# Patient Record
Sex: Female | Born: 1945 | Race: White | Hispanic: No | Marital: Married | Smoking: Former smoker
Health system: Southern US, Community
[De-identification: ages and names within clinical notes are randomized; demographics above are authoritative.]

## PROBLEM LIST (undated history)

## (undated) DIAGNOSIS — J449 Chronic obstructive pulmonary disease, unspecified: Secondary | ICD-10-CM

## (undated) DIAGNOSIS — C3490 Malignant neoplasm of unspecified part of unspecified bronchus or lung: Secondary | ICD-10-CM

## (undated) DIAGNOSIS — C419 Malignant neoplasm of bone and articular cartilage, unspecified: Secondary | ICD-10-CM

## (undated) HISTORY — DX: Malignant neoplasm of bone and articular cartilage, unspecified: C41.9

## (undated) HISTORY — PX: PORTACATH PLACEMENT: SHX2246

## (undated) HISTORY — DX: Chronic obstructive pulmonary disease, unspecified: J44.9

## (undated) HISTORY — DX: Malignant neoplasm of unspecified part of unspecified bronchus or lung: C34.90

---

## 2011-01-28 HISTORY — PX: HIP SURGERY: SHX245

## 2013-11-07 LAB — HM MAMMOGRAPHY

## 2014-01-31 ENCOUNTER — Ambulatory Visit (INDEPENDENT_AMBULATORY_CARE_PROVIDER_SITE_OTHER): Payer: Medicare Other | Admitting: Physician Assistant

## 2014-01-31 ENCOUNTER — Encounter: Payer: Self-pay | Admitting: Physician Assistant

## 2014-01-31 VITALS — BP 128/87 | HR 88 | Wt 211.0 lb

## 2014-01-31 DIAGNOSIS — J449 Chronic obstructive pulmonary disease, unspecified: Secondary | ICD-10-CM | POA: Insufficient documentation

## 2014-01-31 DIAGNOSIS — K219 Gastro-esophageal reflux disease without esophagitis: Secondary | ICD-10-CM | POA: Insufficient documentation

## 2014-01-31 DIAGNOSIS — J441 Chronic obstructive pulmonary disease with (acute) exacerbation: Secondary | ICD-10-CM

## 2014-01-31 DIAGNOSIS — E559 Vitamin D deficiency, unspecified: Secondary | ICD-10-CM

## 2014-01-31 DIAGNOSIS — E785 Hyperlipidemia, unspecified: Secondary | ICD-10-CM

## 2014-01-31 DIAGNOSIS — M810 Age-related osteoporosis without current pathological fracture: Secondary | ICD-10-CM

## 2014-01-31 DIAGNOSIS — I10 Essential (primary) hypertension: Secondary | ICD-10-CM

## 2014-01-31 DIAGNOSIS — J411 Mucopurulent chronic bronchitis: Secondary | ICD-10-CM

## 2014-01-31 DIAGNOSIS — R609 Edema, unspecified: Secondary | ICD-10-CM

## 2014-01-31 MED ORDER — ALBUTEROL SULFATE HFA 108 (90 BASE) MCG/ACT IN AERS: 2.0000 | INHALATION_SPRAY | Freq: Four times a day (QID) | RESPIRATORY_TRACT | Status: DC | PRN

## 2014-01-31 MED ORDER — AZITHROMYCIN 250 MG PO TABS: ORAL_TABLET | ORAL | Status: DC

## 2014-01-31 MED ORDER — PREDNISONE 50 MG PO TABS: ORAL_TABLET | ORAL | Status: DC

## 2014-01-31 NOTE — Patient Instructions (Signed)
Zpak and prednisone given today.  Mucinex(regular) twice a day can be a great help to get mucus up.  Albuterol to use 2 puffs every 4-6 hours as needed.

## 2014-01-31 NOTE — Progress Notes (Signed)
Subjective:    Patient ID: Denise Miles, female    DOB: 05/03/1945, 69 y.o.   MRN: [id number]  HPI Pt is a 69 yo female who presents to the clinic to establish care.   .. Active Ambulatory Problems    Diagnosis Date Noted  . COPD (chronic obstructive pulmonary disease) 01/31/2014  . Benign essential HTN 01/31/2014  . Hyperlipidemia 01/31/2014  . Vitamin D deficiency 01/31/2014  . GERD (gastroesophageal reflux disease) 01/31/2014  . Osteoporosis 01/31/2014  . Peripheral edema 01/31/2014   Resolved Ambulatory Problems    Diagnosis Date Noted  . No Resolved Ambulatory Problems   No Additional Past Medical History   .Marland Kitchen Family History  Problem Relation Age of Onset  . Diabetes Mother   . Heart attack Father   . Alcohol abuse Sister   . Cancer Sister     lung  . Schizophrenia Sister    .Marland Kitchen History   Social History  . Marital Status: Married    Spouse Name: N/A    Number of Children: N/A  . Years of Education: N/A   Occupational History  . Not on file.   Social History Main Topics  . Smoking status: Current Some Day Smoker  . Smokeless tobacco: Not on file  . Alcohol Use: No  . Drug Use: No  . Sexual Activity: Not on file   Other Topics Concern  . Not on file   Social History Narrative  . No narrative on file   Pt has no complaints and does not need refills today.   She has not felt well for 3 weeks. She has COPD. She has notice her breathing is worse. She is more SOB. Continues on Spiriva. She does have a cough that is very productive. She has some sinus pressure and throat drainage. No ear pain or ST. She has tried aleve cold and sinus with little benefit. No fever but had chills off and on.    Review of Systems  All other systems reviewed and are negative.      Objective:   Physical Exam  Constitutional: She is oriented to person, place, and time. She appears well-developed and well-nourished.  HENT:  Head: Normocephalic and atraumatic.  Right  Ear: External ear normal.  Left Ear: External ear normal.  TM"s clear bilaterally with a little erythema in external canal.  Oropharynx erythematous with PND present.  Nares, billaterally red and swollen.   Eyes: Conjunctivae are normal. Right eye exhibits no discharge. Left eye exhibits no discharge.  Neck: Normal range of motion. Neck supple.  Cardiovascular: Normal rate, regular rhythm and normal heart sounds.   Pulmonary/Chest:  Rhonchi and mild wheezing throughout both lungs.  No crackles.   Lymphadenopathy:    She has no cervical adenopathy.  Neurological: She is alert and oriented to person, place, and time.  Skin: Skin is dry.  Psychiatric: She has a normal mood and affect. Her behavior is normal.          Assessment & Plan:  COPD exacerbation- treated with zpak and prednisone for 5 days. Albuterol inhaler given to use 2 puffs every 4-6 hours as needed for SOB. Continue on spiriva. Follow up if not improving in next 2 days.   Vitamin D def- continue on vitamin D.   Peripheral edema- continue on lasix and potassium. Needs follow up in next 2 months for blood work.   osteoprosis- prolia injection twice a year.   GERD- managed without medication currently. Diet only.  Hyperlipidemia- on lovastatin. Needs recheck labs once get records and see when next check is.   HTN- controlled. Follow up in next 77months.

## 2014-02-03 ENCOUNTER — Ambulatory Visit (INDEPENDENT_AMBULATORY_CARE_PROVIDER_SITE_OTHER): Payer: Medicare Other | Admitting: Physician Assistant

## 2014-02-03 ENCOUNTER — Encounter: Payer: Self-pay | Admitting: Physician Assistant

## 2014-02-03 VITALS — BP 145/91 | HR 112 | Wt 208.0 lb

## 2014-02-03 DIAGNOSIS — Z79899 Other long term (current) drug therapy: Secondary | ICD-10-CM

## 2014-02-03 DIAGNOSIS — E876 Hypokalemia: Secondary | ICD-10-CM

## 2014-02-03 DIAGNOSIS — R609 Edema, unspecified: Secondary | ICD-10-CM

## 2014-02-03 DIAGNOSIS — I872 Venous insufficiency (chronic) (peripheral): Secondary | ICD-10-CM

## 2014-02-03 MED ORDER — FUROSEMIDE 40 MG PO TABS: ORAL_TABLET | ORAL | Status: DC

## 2014-02-03 NOTE — Patient Instructions (Signed)

## 2014-02-03 NOTE — Progress Notes (Signed)
   Subjective:    Patient ID: Denise Miles, female    DOB: 1945/09/03, 69 y.o.   MRN: [id number]  HPI  Pt presents to the clinic with worsening bilateral ankle swelling over past 2 days. She is taking lasix 40mg  daily but does not feel like helping. No started any new medications. Admits to not wearing compression stockings and not elevating feet. No pain in ankles. No fall. No redness. Hx of edema.     Review of Systems  All other systems reviewed and are negative.      Objective:   Physical Exam  Constitutional: She is oriented to person, place, and time. She appears well-developed and well-nourished.  HENT:  Head: Normocephalic and atraumatic.  Cardiovascular: Regular rhythm and normal heart sounds.   Tachycardia at 112.   Pulmonary/Chest: Effort normal and breath sounds normal. She has no wheezes.  Neurological: She is alert and oriented to person, place, and time.  Skin:  1+ pitting edema bilaterally not extending past ankles. No redness, pain to palpation evidence of infection.    Psychiatric: She has a normal mood and affect. Her behavior is normal.          Assessment & Plan:  Bilateral ankle edema/hx of hypokalemia due to diruretic/chronic venous insuffiency- will check cmp today. Reminded to take potassium any time takes diruretic. Pt occasionally takes bumex stop taking. Increase lasix to 40mg  bid for next 3 days then decrease back down to once a day or as needed. Discussed importance of compression and elevation. Wrapped in ace wrap today. HO given of venous insuffiency. Follow up as needed. Avoid salt.

## 2014-02-04 LAB — COMPLETE METABOLIC PANEL WITH GFR
ALBUMIN: 3.9 g/dL (ref 3.5–5.2)
ALT: 17 U/L (ref 0–35)
AST: 18 U/L (ref 0–37)
Alkaline Phosphatase: 81 U/L (ref 39–117)
BUN: 20 mg/dL (ref 6–23)
CO2: 27 meq/L (ref 19–32)
Calcium: 9.8 mg/dL (ref 8.4–10.5)
Chloride: 97 mEq/L (ref 96–112)
Creat: 0.89 mg/dL (ref 0.50–1.10)
GFR, EST AFRICAN AMERICAN: 76 mL/min
GFR, EST NON AFRICAN AMERICAN: 66 mL/min
Glucose, Bld: 143 mg/dL — ABNORMAL HIGH (ref 70–99)
Potassium: 3.4 mEq/L — ABNORMAL LOW (ref 3.5–5.3)
Sodium: 137 mEq/L (ref 135–145)
TOTAL PROTEIN: 7.4 g/dL (ref 6.0–8.3)
Total Bilirubin: 0.6 mg/dL (ref 0.2–1.2)

## 2014-02-05 DIAGNOSIS — I872 Venous insufficiency (chronic) (peripheral): Secondary | ICD-10-CM | POA: Insufficient documentation

## 2014-02-05 DIAGNOSIS — E876 Hypokalemia: Secondary | ICD-10-CM | POA: Insufficient documentation

## 2014-02-13 ENCOUNTER — Ambulatory Visit (INDEPENDENT_AMBULATORY_CARE_PROVIDER_SITE_OTHER): Payer: Medicare Other | Admitting: Physician Assistant

## 2014-02-13 ENCOUNTER — Ambulatory Visit (INDEPENDENT_AMBULATORY_CARE_PROVIDER_SITE_OTHER): Payer: Medicare Other

## 2014-02-13 ENCOUNTER — Encounter: Payer: Self-pay | Admitting: Physician Assistant

## 2014-02-13 VITALS — BP 112/73 | HR 81 | Temp 98.0°F | Ht 70.0 in | Wt 208.0 lb

## 2014-02-13 DIAGNOSIS — R05 Cough: Secondary | ICD-10-CM

## 2014-02-13 DIAGNOSIS — J449 Chronic obstructive pulmonary disease, unspecified: Secondary | ICD-10-CM

## 2014-02-13 DIAGNOSIS — R059 Cough, unspecified: Secondary | ICD-10-CM

## 2014-02-13 DIAGNOSIS — J209 Acute bronchitis, unspecified: Secondary | ICD-10-CM

## 2014-02-13 DIAGNOSIS — J441 Chronic obstructive pulmonary disease with (acute) exacerbation: Secondary | ICD-10-CM

## 2014-02-13 DIAGNOSIS — I872 Venous insufficiency (chronic) (peripheral): Secondary | ICD-10-CM

## 2014-02-13 MED ORDER — METHYLPREDNISOLONE SODIUM SUCC 125 MG IJ SOLR: 125.0000 mg | Freq: Once | INTRAMUSCULAR | Status: DC

## 2014-02-13 MED ORDER — TIOTROPIUM BROMIDE-OLODATEROL 2.5-2.5 MCG/ACT IN AERS: 2.0000 | INHALATION_SPRAY | Freq: Every day | RESPIRATORY_TRACT | Status: DC

## 2014-02-13 MED ORDER — DOXYCYCLINE HYCLATE 100 MG PO TABS: 100.0000 mg | ORAL_TABLET | Freq: Two times a day (BID) | ORAL | Status: DC

## 2014-02-13 NOTE — Patient Instructions (Addendum)
Mucinex(guanfenasin) twice a day.  Start Doxycycline.  Stop spiriva.  Start stiolto.

## 2014-02-13 NOTE — Progress Notes (Signed)
   Subjective:    Patient ID: Denise Miles, female    DOB: 13-Jul-1945, 69 y.o.   MRN: [id number]  HPI  Patient is a 69 year old female who presents to the clinic with cough and chest congestion. She does have COPD. She is using Spiriva daily. She is concerned she's not getting the dose. She feels like some days the capsule works and other days it doesn't. She denies any overt wheezing but just feels like her chest is tight. She is coughing more week. Cough is productive with greenish to white sputum. She denies any fever or chills.  Venous insufficiency-she did pick up some compression stockings and noticed they are working great. She is only taking 40 mg of Lasix daily.    Review of Systems  All other systems reviewed and are negative.      Objective:   Physical Exam  Constitutional: She is oriented to person, place, and time. She appears well-developed and well-nourished.  HENT:  Head: Normocephalic and atraumatic.  Right Ear: External ear normal.  Left Ear: External ear normal.  Mouth/Throat: Oropharynx is clear and moist.  Eyes: Conjunctivae are normal.  Neck: Normal range of motion. Neck supple.  Cardiovascular: Normal rate, regular rhythm and normal heart sounds.   Pulmonary/Chest: Effort normal and breath sounds normal.  No wheezing.  Bilateral coarse breath sounds and rhonchi.   Lymphadenopathy:    She has no cervical adenopathy.  Neurological: She is alert and oriented to person, place, and time.  Skin: Skin is dry.  Compression stockings on today. No pitting edema bilaterally.   Psychiatric: She has a normal mood and affect. Her behavior is normal.          Assessment & Plan:  COPD exacerbation/acute bronchitis- we'll go ahead and get chest x-rays since she has had 2 exacerbations this month. We'll treat with doxycycline for 10 days. Continue to use albuterol inhaler as needed for shortness of breath. Shot of Solu-Medrol 125 mg given IM today with no  complications. I'm concerned patient was getting Spiriva dosage due to complications with device. Even if she was getting the dosage she is not controlled on current therapy. Patient was given sample of Stiolto for more coverage. Went over how to use device and first 2 puffs given today. Discussed can also use some mucinex when mucus starts to get thick.  Follow up in one month.   Venous insuffiency- swelling much better today with compression stockings. Continue to use compression stockings regularly. The swelling space maintained decrease to one half of the 40 mg of Lasix daily. Only increase the swelling increases. Encouraged low-salt diet.

## 2014-02-22 ENCOUNTER — Telehealth: Payer: Self-pay | Admitting: *Deleted

## 2014-02-22 DIAGNOSIS — Z79899 Other long term (current) drug therapy: Secondary | ICD-10-CM

## 2014-02-22 DIAGNOSIS — E876 Hypokalemia: Secondary | ICD-10-CM

## 2014-02-22 DIAGNOSIS — Z0000 Encounter for general adult medical examination without abnormal findings: Secondary | ICD-10-CM

## 2014-02-22 DIAGNOSIS — M810 Age-related osteoporosis without current pathological fracture: Secondary | ICD-10-CM

## 2014-02-22 DIAGNOSIS — E785 Hyperlipidemia, unspecified: Secondary | ICD-10-CM

## 2014-02-22 DIAGNOSIS — E559 Vitamin D deficiency, unspecified: Secondary | ICD-10-CM

## 2014-02-22 NOTE — Telephone Encounter (Signed)
Labs ordered for upcoming CPE.

## 2014-02-24 ENCOUNTER — Encounter: Payer: Self-pay | Admitting: Physician Assistant

## 2014-02-24 ENCOUNTER — Ambulatory Visit (INDEPENDENT_AMBULATORY_CARE_PROVIDER_SITE_OTHER): Payer: Medicare Other | Admitting: Physician Assistant

## 2014-02-24 VITALS — BP 116/69 | HR 91 | Ht 70.0 in | Wt 209.0 lb

## 2014-02-24 DIAGNOSIS — M810 Age-related osteoporosis without current pathological fracture: Secondary | ICD-10-CM

## 2014-02-24 DIAGNOSIS — F32A Depression, unspecified: Secondary | ICD-10-CM

## 2014-02-24 DIAGNOSIS — F329 Major depressive disorder, single episode, unspecified: Secondary | ICD-10-CM

## 2014-02-24 DIAGNOSIS — J411 Mucopurulent chronic bronchitis: Secondary | ICD-10-CM

## 2014-02-24 DIAGNOSIS — F411 Generalized anxiety disorder: Secondary | ICD-10-CM | POA: Insufficient documentation

## 2014-02-24 LAB — COMPLETE METABOLIC PANEL WITH GFR
ALBUMIN: 3.3 g/dL — AB (ref 3.5–5.2)
ALT: 11 U/L (ref 0–35)
AST: 12 U/L (ref 0–37)
Alkaline Phosphatase: 85 U/L (ref 39–117)
BILIRUBIN TOTAL: 1.1 mg/dL (ref 0.2–1.2)
BUN: 16 mg/dL (ref 6–23)
CALCIUM: 9.1 mg/dL (ref 8.4–10.5)
CHLORIDE: 104 meq/L (ref 96–112)
CO2: 26 mEq/L (ref 19–32)
Creat: 0.82 mg/dL (ref 0.50–1.10)
GFR, Est African American: 84 mL/min
GFR, Est Non African American: 73 mL/min
GLUCOSE: 87 mg/dL (ref 70–99)
Potassium: 3.7 mEq/L (ref 3.5–5.3)
SODIUM: 141 meq/L (ref 135–145)
TOTAL PROTEIN: 6 g/dL (ref 6.0–8.3)

## 2014-02-24 LAB — LIPID PANEL
CHOL/HDL RATIO: 3.8 ratio
Cholesterol: 182 mg/dL (ref 0–200)
HDL: 48 mg/dL (ref >39)
LDL CALC: 112 mg/dL — AB (ref 0–99)
TRIGLYCERIDES: 110 mg/dL (ref <150)
VLDL: 22 mg/dL (ref 0–40)

## 2014-02-24 MED ORDER — BUPROPION HCL ER (XL) 150 MG PO TB24: 150.0000 mg | ORAL_TABLET | Freq: Every day | ORAL | Status: DC

## 2014-02-24 NOTE — Progress Notes (Signed)
   Subjective:    Patient ID: Denise Miles, female    DOB: [birth year], 69 y.o.   MRN: [id number]  HPI Patient is 69 year old female who presents to the clinic for one-month follow-up after starting stilto for COPD. Patient is doing wonderfully. She has not been having to use her rescue inhaler. She still has some minor sputum but able to get up without any significant problems. She's feeling great.  Depression/anxiety-patient has been on Wellbutrin since the death of her son. She feels like she does not need this anymore. She's been on many years. She has no anxiety or depression per patient.     Review of Systems  All other systems reviewed and are negative.      Objective:   Physical Exam  Constitutional: She is oriented to person, place, and time. She appears well-developed and well-nourished.  HENT:  Head: Normocephalic and atraumatic.  Cardiovascular: Normal rate, regular rhythm and normal heart sounds.   Pulmonary/Chest: Effort normal and breath sounds normal. She has no wheezes.  Neurological: She is alert and oriented to person, place, and time.  Psychiatric: She has a normal mood and affect. Her behavior is normal.          Assessment & Plan:  COPD-pt is doing great on stiolto. Continue using once a day. Follow up in 6 months.   Depression/anxiety- GAD-7 was 0. PH Q9 was 0. Discuss Wellbutrin 150 mg every other day for the next 4 weeks then stop. If she finds her anxiety starts to increase during the taper down. Certainly go back up to full dose and we'll refill accordingly. Seems like patient was going through an acute time of stress and likely only needed the medicine transiently.  Osteoporosis-patient has one properly a injection at home. She is due in April. She will alert [place] for next dose.  Patient had fasting labs done today. We'll call with results. Patient still needs a Medicare annual wellness exam.

## 2014-02-25 LAB — VITAMIN D 25 HYDROXY (VIT D DEFICIENCY, FRACTURES): Vit D, 25-Hydroxy: 39 ng/mL (ref 30–100)

## 2014-03-10 ENCOUNTER — Other Ambulatory Visit: Payer: Self-pay

## 2014-03-10 MED ORDER — TIOTROPIUM BROMIDE-OLODATEROL 2.5-2.5 MCG/ACT IN AERS: 2.0000 | INHALATION_SPRAY | Freq: Every day | RESPIRATORY_TRACT | Status: DC

## 2014-03-24 ENCOUNTER — Ambulatory Visit (INDEPENDENT_AMBULATORY_CARE_PROVIDER_SITE_OTHER): Payer: Medicare Other | Admitting: Physician Assistant

## 2014-03-24 ENCOUNTER — Encounter: Payer: Self-pay | Admitting: Physician Assistant

## 2014-03-24 VITALS — BP 124/86 | HR 93 | Wt 202.0 lb

## 2014-03-24 DIAGNOSIS — Z0000 Encounter for general adult medical examination without abnormal findings: Secondary | ICD-10-CM

## 2014-03-24 DIAGNOSIS — Z1239 Encounter for other screening for malignant neoplasm of breast: Secondary | ICD-10-CM

## 2014-03-24 DIAGNOSIS — Z13820 Encounter for screening for osteoporosis: Secondary | ICD-10-CM

## 2014-03-24 DIAGNOSIS — Z1231 Encounter for screening mammogram for malignant neoplasm of breast: Secondary | ICD-10-CM

## 2014-03-24 MED ORDER — ATENOLOL 25 MG PO TABS: 25.0000 mg | ORAL_TABLET | Freq: Every day | ORAL | Status: DC

## 2014-03-24 MED ORDER — TIOTROPIUM BROMIDE-OLODATEROL 2.5-2.5 MCG/ACT IN AERS: 2.0000 | INHALATION_SPRAY | Freq: Every day | RESPIRATORY_TRACT | Status: DC

## 2014-03-24 NOTE — Progress Notes (Signed)
Subjective:    Denise Miles is a 69 y.o. female who presents for Medicare Annual/Subsequent preventive examination.  Preventive Screening-Counseling & Management  Tobacco History  Smoking status  . Current Some Day Smoker  Smokeless tobacco  . Not on file     Problems Prior to Visit  None today.   Current Problems (verified) Patient Active Problem List   Diagnosis Date Noted  . Depression 02/24/2014  . Anxiety state 02/24/2014  . Hypokalemia 02/05/2014  . Venous (peripheral) insufficiency 02/05/2014  . COPD (chronic obstructive pulmonary disease) 01/31/2014  . Benign essential HTN 01/31/2014  . Hyperlipidemia 01/31/2014  . Vitamin D deficiency 01/31/2014  . GERD (gastroesophageal reflux disease) 01/31/2014  . Osteoporosis 01/31/2014  . Peripheral edema 01/31/2014    Medications Prior to Visit Current Outpatient Prescriptions on File Prior to Visit  Medication Sig Dispense Refill  . albuterol (PROVENTIL HFA;VENTOLIN HFA) 108 (90 BASE) MCG/ACT inhaler Inhale 2 puffs into the lungs every 6 (six) hours as needed for wheezing or shortness of breath. 1 Inhaler 2  . cetirizine (ZYRTEC) 10 MG tablet Take 10 mg by mouth daily.    Marland Kitchen denosumab (PROLIA) 60 MG/ML SOLN injection Inject 60 mg into the skin every 6 (six) months. Administer in upper arm, thigh, or abdomen    . furosemide (LASIX) 40 MG tablet Take 1-2 tablets daily as needed for peripheral edema. 30 tablet 0  . lovastatin (MEVACOR) 40 MG tablet Take 40 mg by mouth at bedtime.    . Multiple Vitamin (MULTIVITAMIN) tablet Take 1 tablet by mouth daily.    Marland Kitchen Plant Sterol Stanol-Pantethine (CHOLEST OFF COMPLETE PO) Take by mouth daily.    . potassium chloride (K-DUR,KLOR-CON) 10 MEQ tablet Take 10 mEq by mouth 2 (two) times daily.    Marland Kitchen triamcinolone (NASACORT ALLERGY 24HR) 55 MCG/ACT AERO nasal inhaler Place 2 sprays into the nose daily.    . Vitamin D, Ergocalciferol, (DRISDOL) 50000 UNITS CAPS capsule Take 50,000 Units by  mouth every 7 (seven) days.     No current facility-administered medications on file prior to visit.    Current Medications (verified) Current Outpatient Prescriptions  Medication Sig Dispense Refill  . albuterol (PROVENTIL HFA;VENTOLIN HFA) 108 (90 BASE) MCG/ACT inhaler Inhale 2 puffs into the lungs every 6 (six) hours as needed for wheezing or shortness of breath. 1 Inhaler 2  . atenolol (TENORMIN) 25 MG tablet Take 1 tablet (25 mg total) by mouth daily. 30 tablet 5  . cetirizine (ZYRTEC) 10 MG tablet Take 10 mg by mouth daily.    Marland Kitchen denosumab (PROLIA) 60 MG/ML SOLN injection Inject 60 mg into the skin every 6 (six) months. Administer in upper arm, thigh, or abdomen    . furosemide (LASIX) 40 MG tablet Take 1-2 tablets daily as needed for peripheral edema. 30 tablet 0  . lovastatin (MEVACOR) 40 MG tablet Take 40 mg by mouth at bedtime.    . Multiple Vitamin (MULTIVITAMIN) tablet Take 1 tablet by mouth daily.    Marland Kitchen Plant Sterol Stanol-Pantethine (CHOLEST OFF COMPLETE PO) Take by mouth daily.    . potassium chloride (K-DUR,KLOR-CON) 10 MEQ tablet Take 10 mEq by mouth 2 (two) times daily.    . Tiotropium Bromide-Olodaterol (STIOLTO RESPIMAT) 2.5-2.5 MCG/ACT AERS Inhale 2 puffs into the lungs daily. 1 Inhaler 5  . triamcinolone (NASACORT ALLERGY 24HR) 55 MCG/ACT AERO nasal inhaler Place 2 sprays into the nose daily.    . Vitamin D, Ergocalciferol, (DRISDOL) 50000 UNITS CAPS capsule Take 50,000 Units by  mouth every 7 (seven) days.     No current facility-administered medications for this visit.     Allergies (verified) Latex   PAST HISTORY  Family History Family History  Problem Relation Age of Onset  . Diabetes Mother   . Heart attack Father   . Alcohol abuse Sister   . Cancer Sister     lung  . Schizophrenia Sister     Social History History  Substance Use Topics  . Smoking status: Current Some Day Smoker  . Smokeless tobacco: Not on file  . Alcohol Use: No     Are there  smokers in your home (other than you)? No  Risk Factors Current exercise habits: The patient does not participate in regular exercise at present.  Dietary issues discussed: none   Cardiac risk factors: advanced age (older than 79 for men, 37 for women), dyslipidemia, obesity (BMI >= 30 kg/m2) and sedentary lifestyle.  Depression Screen (Note: if answer to either of the following is "Yes", a more complete depression screening is indicated)   Over the past two weeks, have you felt down, depressed or hopeless? No  Over the past two weeks, have you felt little interest or pleasure in doing things? No  Have you lost interest or pleasure in daily life? No  Do you often feel hopeless? No  Do you cry easily over simple problems? No  Activities of Daily Living In your present state of health, do you have any difficulty performing the following activities?:  Driving? No Managing money?  No Feeding yourself? No Getting from bed to chair? No Climbing a flight of stairs? No Preparing food and eating?: No Bathing or showering? No Getting dressed: No Getting to the toilet? No Using the toilet:No Moving around from place to place: No In the past year have you fallen or had a near fall?:No   Are you sexually active?  No  Do you have more than one partner?  No  Hearing Difficulties: No Do you often ask people to speak up or repeat themselves? No Do you experience ringing or noises in your ears? No Do you have difficulty understanding soft or whispered voices? No   Do you feel that you have a problem with memory? No  Do you often misplace items? No  Do you feel safe at home?  Yes  Cognitive Testing  Alert? Yes  Normal Appearance?Yes  Oriented to person? Yes  Place? Yes   Time? Yes  Recall of three objects?  Yes  Can perform simple calculations? Yes  Displays appropriate judgment?Yes  Can read the correct time from a watch face?Yes   Advanced Directives have been discussed with the  patient? Yes  List the Names of Other Physician/Practitioners you currently use: 1.    Indicate any recent Medical Services you may have received from other than [facility] providers in the past year (date may be approximate).  Immunization History  Administered Date(s) Administered  . Influenza-Unspecified 09/27/2013  . Pneumococcal Conjugate-13 09/27/2013    Screening Tests Health Maintenance  Topic Date Due  . TETANUS/TDAP  08/19/1962  . MAMMOGRAM  08/18/1993  . COLONOSCOPY  08/18/1993  . DEXA SCAN  08/18/2008  . PNEUMOCOCCAL POLYSACCHARIDE VACCINE AGE 50 AND OVER  08/18/2008  . ZOSTAVAX  03/25/2015 (Originally 08/19/2003)  . INFLUENZA VACCINE  08/28/2014    All answers were reviewed with the patient and necessary referrals were made:  [facility], JADE, PA-C   03/24/2014   History reviewed: allergies, current medications, past  family history, past medical history, past social history, past surgical history and problem list  Review of Systems A comprehensive review of systems was negative.    Objective:     Vision by Snellen chart: pt has eye exam coming up. No vision changes or complaints.   Body mass index is 28.98 kg/(m^2). BP 124/86 mmHg  Pulse 93  Wt 202 lb (91.627 kg)  SpO2 100%  BP 124/86 mmHg  Pulse 93  Wt 202 lb (91.627 kg)  SpO2 100%  General Appearance:    Alert, cooperative, no distress, appears stated age  Head:    Normocephalic, without obvious abnormality, atraumatic  Eyes:    PERRL, conjunctiva/corneas clear, EOM's intact, fundi    benign, both eyes  Ears:    Normal TM's and external ear canals, both ears  Nose:   Nares normal, septum midline, mucosa normal, no drainage    or sinus tenderness  Throat:   Lips, mucosa, and tongue normal; teeth and gums normal  Neck:   Supple, symmetrical, trachea midline, no adenopathy;    thyroid:  no enlargement/tenderness/nodules; no carotid   bruit or JVD  Back:     Symmetric, no curvature, ROM normal, no CVA  tenderness  Lungs:     Clear to auscultation bilaterally, respirations unlabored  Chest Wall:    No tenderness or deformity   Heart:    Regular rate and rhythm, S1 and S2 normal, no murmur, rub   or gallop  Breast Exam:    Not done.   Abdomen:     Soft, non-tender, bowel sounds active all four quadrants,    no masses, no organomegaly  Genitalia:  Not done.   Rectal:  Not done.   Extremities:   Extremities normal, atraumatic, no cyanosis or edema  Pulses:   2+ and symmetric all extremities  Skin:   Skin color, texture, turgor normal, no rashes or lesions  Lymph nodes:   Cervical, supraclavicular, and axillary nodes normal  Neurologic:   CNII-XII intact, normal strength, sensation and reflexes    throughout       Assessment:          Plan:     During the course of the visit the patient was educated and counseled about appropriate screening and preventive services including:    Pneumococcal vaccine   Screening mammography  Bone densitometry screening  Colorectal cancer screening  Will ordered mammogram and bone density.   Per pt colonoscopy, tdap, pneumonia vaccines done. We are still waiting for records.   Make appt in may for prolia injection(nurse visit only)  Dicussed labs in office looks good.    Patient Instructions (the written plan) was given to the patient.  Medicare Attestation I have personally reviewed: The patient's medical and social history Their use of alcohol, tobacco or illicit drugs Their current medications and supplements The patient's functional ability including ADLs,fall risks, home safety risks, cognitive, and hearing and visual impairment Diet and physical activities Evidence for depression or mood disorders  The patient's weight, height, BMI, and visual acuity have been recorded in the chart.  I have made referrals, counseling, and provided education to the patient based on review of the above and I have provided the patient with a  written personalized care plan for preventive services.     Iran Planas, PA-C   03/24/2014

## 2014-03-24 NOTE — Patient Instructions (Addendum)
Will order bone density and mammogram.   Keeping You Healthy  Get These Tests  Blood Pressure- Have your blood pressure checked by your healthcare provider at least once a year.  Normal blood pressure is 120/80.  Weight- Have your body mass index (BMI) calculated to screen for obesity.  BMI is a measure of body fat based on height and weight.  You can calculate your own BMI at [employer].it  Cholesterol- Have your cholesterol checked every year.  Diabetes- Have your blood sugar checked every year if you have high blood pressure, high cholesterol, a family history of diabetes or if you are overweight.  Pap Smear- Have a pap smear every 1 to 3 years if you have been sexually active.  If you are older than 65 and recent pap smears have been normal you may not need additional pap smears.  In addition, if you have had a hysterectomy  For benign disease additional pap smears are not necessary.  Mammogram-Yearly mammograms are essential for early detection of breast cancer  Screening for Colon Cancer- Colonoscopy starting at age 61. Screening may begin sooner depending on your family history and other health conditions.  Follow up colonoscopy as directed by your Gastroenterologist.  Screening for Osteoporosis- Screening begins at age 51 with bone density scanning, sooner if you are at higher risk for developing Osteoporosis.  Get these medicines  Calcium with Vitamin D- Your body requires 1200-1500 mg of Calcium a day and [phone] IU of Vitamin D a day.  You can only absorb 500 mg of Calcium at a time therefore Calcium must be taken in 2 or 3 separate doses throughout the day.  Hormones- Hormone therapy has been associated with increased risk for certain cancers and heart disease.  Talk to your healthcare provider about if you need relief from menopausal symptoms.  Aspirin- Ask your healthcare provider about taking Aspirin to prevent Heart Disease and Stroke.  Get these  Immuniztions  Flu shot- Every fall  Pneumonia shot- Once after the age of 56; if you are younger ask your healthcare provider if you need a pneumonia shot.  Tetanus- Every ten years.  Zostavax- Once after the age of 55 to prevent shingles.  Take these steps  Don't smoke- Your healthcare provider can help you quit. For tips on how to quit, ask your healthcare provider or go to www.smokefree.gov or call 1-800 QUIT-NOW.  Be physically active- Exercise 5 days a week for a minimum of 30 minutes.  If you are not already physically active, start slow and gradually work up to 30 minutes of moderate physical activity.  Try walking, dancing, bike riding, swimming, etc.  Eat a healthy diet- Eat a variety of healthy foods such as fruits, vegetables, whole grains, low fat milk, low fat cheeses, yogurt, lean meats, chicken, fish, eggs, dried beans, tofu, etc.  For more information go to www.thenutritionsource.org  Dental visit- Brush and floss teeth twice daily; visit your dentist twice a year.  Eye exam- Visit your Optometrist or Ophthalmologist yearly.  Drink alcohol in moderation- Limit alcohol intake to one drink or less a day.  Never drink and drive.  Depression- Your emotional health is as important as your physical health.  If you're feeling down or losing interest in things you normally enjoy, please talk to your healthcare provider.  Seat Belts- can save your life; always wear one  Smoke/Carbon Monoxide detectors- These detectors need to be installed on the appropriate level of your home.  Replace batteries at  least once a year.  Violence- If anyone is threatening or hurting you, please tell your healthcare provider.  Living Will/ Health care power of attorney- Discuss with your healthcare provider and family.

## 2014-03-27 ENCOUNTER — Telehealth: Payer: Self-pay | Admitting: Physician Assistant

## 2014-03-27 NOTE — Telephone Encounter (Signed)
Amber I really need last Tdap, pneumonia vaccines, colonoscopy and zostavax to make sure pt is up to date. I have not received records. If I could just get this information. She was seen in [facility] by Dr. Percell Locus.

## 2014-03-30 NOTE — Telephone Encounter (Signed)
Ok thanks! I just didn't want to repeat anything.

## 2014-03-30 NOTE — Telephone Encounter (Signed)
Spoke with Ebony Hail at [employer] this morning, she is sending over all vaccines, last colonoscopy, & mammogram.  We haven't received her records because the patient signed the release to have them sent directly to her.

## 2014-03-31 ENCOUNTER — Encounter: Payer: Self-pay | Admitting: Physician Assistant

## 2014-03-31 DIAGNOSIS — R921 Mammographic calcification found on diagnostic imaging of breast: Secondary | ICD-10-CM | POA: Insufficient documentation

## 2014-03-31 DIAGNOSIS — Z86010 Personal history of colonic polyps: Secondary | ICD-10-CM | POA: Insufficient documentation

## 2014-05-01 ENCOUNTER — Encounter: Payer: Self-pay | Admitting: Physician Assistant

## 2014-05-01 ENCOUNTER — Ambulatory Visit (INDEPENDENT_AMBULATORY_CARE_PROVIDER_SITE_OTHER): Payer: Medicare Other | Admitting: Physician Assistant

## 2014-05-01 VITALS — BP 136/93 | HR 91 | Wt 200.0 lb

## 2014-05-01 DIAGNOSIS — J4520 Mild intermittent asthma, uncomplicated: Secondary | ICD-10-CM | POA: Diagnosis not present

## 2014-05-01 DIAGNOSIS — J209 Acute bronchitis, unspecified: Secondary | ICD-10-CM

## 2014-05-01 MED ORDER — PREDNISONE 50 MG PO TABS: ORAL_TABLET | ORAL | Status: DC

## 2014-05-01 MED ORDER — AZITHROMYCIN 250 MG PO TABS: ORAL_TABLET | ORAL | Status: DC

## 2014-05-01 NOTE — Progress Notes (Signed)
   Subjective:    Patient ID: Denise Miles, female    DOB: 1945-10-24, 68 y.o.   MRN: [id number]  HPI Pt is a 69 yo female who presents to the clinic with 2 weeks of nasal congestion, sinus pressure, ear pressure and now cough. She is coughing up and blowing out yellowish sputum. She denies any fever. She is using her Nasacort and Aleve as needed. She does feel like she is becoming increasingly short of breath and wheezing some. She's having to use her albuterol inhaler more frequently.   Review of Systems  All other systems reviewed and are negative.      Objective:   Physical Exam  Constitutional: She is oriented to person, place, and time. She appears well-developed and well-nourished.  HENT:  Head: Normocephalic and atraumatic.  Right Ear: External ear normal.  Left Ear: External ear normal.  Mouth/Throat: Oropharynx is clear and moist. No oropharyngeal exudate.  TMs slightly erythematous with some fluid present behind. There is some tenderness over maxillary and nasal bridge to palpation.  Nasal turbinates red and swollen bilaterally.  Eyes: Conjunctivae are normal. Right eye exhibits no discharge. Left eye exhibits no discharge.  Neck: Normal range of motion. Neck supple.  Cardiovascular: Normal rate, regular rhythm and normal heart sounds.   Pulmonary/Chest:  Wheezing with scattered rhonchi over the base of left lung.  Lymphadenopathy:    She has no cervical adenopathy.  Neurological: She is alert and oriented to person, place, and time.  Skin: Skin is dry.  Psychiatric: She has a normal mood and affect. Her behavior is normal.          Assessment & Plan:  Acute bronchitis with reactive airway disease- treated with zpak and prednisone for 5 days. Continue nasocort nasal spray. Nasal saline rinses encouraged. Follow up if not improving.

## 2014-06-12 ENCOUNTER — Ambulatory Visit (INDEPENDENT_AMBULATORY_CARE_PROVIDER_SITE_OTHER): Payer: Medicare Other | Admitting: Family Medicine

## 2014-06-12 ENCOUNTER — Encounter: Payer: Self-pay | Admitting: Family Medicine

## 2014-06-12 VITALS — BP 132/90 | HR 120 | Temp 98.2°F | Wt 194.0 lb

## 2014-06-12 DIAGNOSIS — J329 Chronic sinusitis, unspecified: Secondary | ICD-10-CM

## 2014-06-12 DIAGNOSIS — B9689 Other specified bacterial agents as the cause of diseases classified elsewhere: Secondary | ICD-10-CM

## 2014-06-12 DIAGNOSIS — J410 Simple chronic bronchitis: Secondary | ICD-10-CM | POA: Diagnosis not present

## 2014-06-12 DIAGNOSIS — A499 Bacterial infection, unspecified: Secondary | ICD-10-CM | POA: Diagnosis not present

## 2014-06-12 MED ORDER — LEVOFLOXACIN 500 MG PO TABS: 500.0000 mg | ORAL_TABLET | Freq: Every day | ORAL | Status: DC

## 2014-06-12 MED ORDER — TIOTROPIUM BROMIDE-OLODATEROL 2.5-2.5 MCG/ACT IN AERS: 2.0000 | INHALATION_SPRAY | Freq: Every day | RESPIRATORY_TRACT | Status: DC

## 2014-06-12 NOTE — Progress Notes (Signed)
CC: Denise Miles is a 69 y.o. female is here for Sinusitis   Subjective: HPI:  Facial pressure in the cheeks and forehead with postnasal drip productive cough and increased wheezing. This is been present for the past week. It seems to be persistent and mild to moderate in severity periods present all hours of the day. Improves with albuterol or stiolto however only for a few hours. She denies any new shortness of breath from baseline. Denies blood in sputum. Reports fatigue. Denies fevers, chills, nausea, abdominal pain, chest pain, new back pain.   Review Of Systems Outlined In HPI  Past Medical History  Diagnosis Date  . COPD (chronic obstructive pulmonary disease)     Past Surgical History  Procedure Laterality Date  . Hip surgery Right 2013   Family History  Problem Relation Age of Onset  . Diabetes Mother   . Heart attack Father   . Alcohol abuse Sister   . Cancer Sister     lung  . Schizophrenia Sister     History   Social History  . Marital Status: Married    Spouse Name: N/A  . Number of Children: N/A  . Years of Education: N/A   Occupational History  . Not on file.   Social History Main Topics  . Smoking status: Current Some Day Smoker  . Smokeless tobacco: Not on file  . Alcohol Use: No  . Drug Use: No  . Sexual Activity: Not on file   Other Topics Concern  . Not on file   Social History Narrative     Objective: BP 132/90 mmHg  Pulse 120  Temp(Src) 98.2 F (36.8 C) (Oral)  Wt 194 lb (87.998 kg)  General: Alert and Oriented, No Acute Distress HEENT: Pupils equal, round, reactive to light. Conjunctivae clear.  External ears unremarkable, canals clear with intact TMs with appropriate landmarks.  Middle ear appears open without effusion. Pink inferior turbinates.  Moist mucous membranes, pharynx without inflammation nor lesions.  Neck supple without palpable lymphadenopathy nor abnormal masses. Lungs: Comfortable work of breathing with trace  expiratory rhonchi in the central lung fields no wheezing nor rales. Extremities: No peripheral edema.  Strong peripheral pulses.  Mental Status: No depression, anxiety, nor agitation. Skin: Warm and dry.  Assessment & Plan: Tiane was seen today for sinusitis.  Diagnoses and all orders for this visit:  Bacterial sinusitis Orders: -     levofloxacin (LEVAQUIN) 500 MG tablet; Take 1 tablet (500 mg total) by mouth daily.  Simple chronic bronchitis Orders: -     Tiotropium Bromide-Olodaterol (STIOLTO RESPIMAT) 2.5-2.5 MCG/ACT AERS; Inhale 2 puffs into the lungs daily.   Bacterial sinusitis with chronic bronchitis exacerbation. Holding off on steroids unless she becomes short of breath or Levaquin alone is ineffective.Signs and symptoms requring emergent/urgent reevaluation were discussed with the patient.  Return if symptoms worsen or fail to improve.

## 2014-06-27 ENCOUNTER — Ambulatory Visit (INDEPENDENT_AMBULATORY_CARE_PROVIDER_SITE_OTHER): Payer: Medicare Other | Admitting: Family Medicine

## 2014-06-27 ENCOUNTER — Encounter: Payer: Self-pay | Admitting: Family Medicine

## 2014-06-27 VITALS — BP 128/85 | HR 138 | Temp 97.8°F | Wt 192.0 lb

## 2014-06-27 DIAGNOSIS — J441 Chronic obstructive pulmonary disease with (acute) exacerbation: Secondary | ICD-10-CM

## 2014-06-27 MED ORDER — AZITHROMYCIN 250 MG PO TABS: ORAL_TABLET | ORAL | Status: AC

## 2014-06-27 MED ORDER — PREDNISONE 20 MG PO TABS: ORAL_TABLET | ORAL | Status: AC

## 2014-06-27 NOTE — Progress Notes (Signed)
CC: Denise Miles is a 69 y.o. female is here for Sinusitis   Subjective: HPI:  Complaints of productive cough present for the last 4-5 days. She was in her regular state of health after finishing Levaquin when I saw her last.symptoms are mild-to-moderate in severity. They're worsening since onset. Present all hours of the day. Nothing particularly makes it better or worse. Denies fevers, chills, wheezing, shortness of breath, chest pain, nasal congestion or sore throat.   Review Of Systems Outlined In HPI  Past Medical History  Diagnosis Date  . COPD (chronic obstructive pulmonary disease)     Past Surgical History  Procedure Laterality Date  . Hip surgery Right 2013   Family History  Problem Relation Age of Onset  . Diabetes Mother   . Heart attack Father   . Alcohol abuse Sister   . Cancer Sister     lung  . Schizophrenia Sister     History   Social History  . Marital Status: Married    Spouse Name: N/A  . Number of Children: N/A  . Years of Education: N/A   Occupational History  . Not on file.   Social History Main Topics  . Smoking status: Current Some Day Smoker  . Smokeless tobacco: Not on file  . Alcohol Use: No  . Drug Use: No  . Sexual Activity: Not on file   Other Topics Concern  . Not on file   Social History Narrative     Objective: BP 128/85 mmHg  Pulse 138  Temp(Src) 97.8 F (36.6 C) (Oral)  Wt 192 lb (87.091 kg)  SpO2 95%  General: Alert and Oriented, No Acute Distress HEENT: Pupils equal, round, reactive to light. Conjunctivae clear.  External ears unremarkable, canals clear with intact TMs with appropriate landmarks.  Middle ear appears open without effusion. Pink inferior turbinates.  Moist mucous membranes, pharynx without inflammation nor lesions.  Neck supple without palpable lymphadenopathy nor abnormal masses. Lungs: comfortable work of breathing, frequent coughing, rhonchi in all posterior lung fields no rales or  wheezing. Cardiac: Regular rate and rhythm. Normal S1/S2.  No murmurs, rubs, nor gallops.   Extremities: No peripheral edema.  Strong peripheral pulses.  Mental Status: No depression, anxiety, nor agitation. Skin: Warm and dry.  Assessment & Plan: Denise Miles was seen today for sinusitis.  Diagnoses and all orders for this visit:  COPD exacerbation Orders: -     predniSONE (DELTASONE) 20 MG tablet; Three tabs at once daily for five days. -     azithromycin (ZITHROMAX) 250 MG tablet; Take two tabs at once on day 1, then one tab daily on days 2-5.   COPD exacerbation: Start azithromycin and prednisone.Signs and symptoms requring emergent/urgent reevaluation were discussed with the patient. Begin albuterol scheduled every 4-6 hours for the next 48 hours  Return if symptoms worsen or fail to improve.

## 2014-07-20 ENCOUNTER — Encounter: Payer: Self-pay | Admitting: *Deleted

## 2014-08-14 ENCOUNTER — Telehealth: Payer: Self-pay | Admitting: Physician Assistant

## 2014-08-14 MED ORDER — POTASSIUM CHLORIDE CRYS ER 10 MEQ PO TBCR: 10.0000 meq | EXTENDED_RELEASE_TABLET | Freq: Two times a day (BID) | ORAL | Status: DC

## 2014-08-14 NOTE — Telephone Encounter (Signed)
Potchlorer tabs 10 meq twice a day and Uitd 2 cap 1.25 mg 50,000 1 cap weekly to [employer] order. Thanks

## 2014-08-14 NOTE — Telephone Encounter (Signed)
Refilled potassium. Vitamin D switched to OTC per lab results. Patient advised.

## 2014-09-05 ENCOUNTER — Other Ambulatory Visit: Payer: Self-pay | Admitting: Physician Assistant

## 2014-09-05 DIAGNOSIS — J410 Simple chronic bronchitis: Secondary | ICD-10-CM

## 2014-09-05 MED ORDER — TIOTROPIUM BROMIDE-OLODATEROL 2.5-2.5 MCG/ACT IN AERS: 2.0000 | INHALATION_SPRAY | Freq: Every day | RESPIRATORY_TRACT | Status: DC

## 2014-09-25 ENCOUNTER — Ambulatory Visit (INDEPENDENT_AMBULATORY_CARE_PROVIDER_SITE_OTHER): Payer: Medicare Other

## 2014-09-25 ENCOUNTER — Ambulatory Visit (INDEPENDENT_AMBULATORY_CARE_PROVIDER_SITE_OTHER): Payer: Medicare Other | Admitting: Physician Assistant

## 2014-09-25 ENCOUNTER — Encounter: Payer: Self-pay | Admitting: Physician Assistant

## 2014-09-25 VITALS — BP 159/85 | HR 97 | Ht 70.0 in | Wt 180.0 lb

## 2014-09-25 DIAGNOSIS — J441 Chronic obstructive pulmonary disease with (acute) exacerbation: Secondary | ICD-10-CM | POA: Diagnosis not present

## 2014-09-25 DIAGNOSIS — J410 Simple chronic bronchitis: Secondary | ICD-10-CM

## 2014-09-25 MED ORDER — TIOTROPIUM BROMIDE-OLODATEROL 2.5-2.5 MCG/ACT IN AERS: 2.0000 | INHALATION_SPRAY | Freq: Every day | RESPIRATORY_TRACT | Status: DC

## 2014-09-25 MED ORDER — ALBUTEROL SULFATE HFA 108 (90 BASE) MCG/ACT IN AERS: 2.0000 | INHALATION_SPRAY | Freq: Four times a day (QID) | RESPIRATORY_TRACT | Status: DC | PRN

## 2014-09-25 MED ORDER — PREDNISONE 20 MG PO TABS: ORAL_TABLET | ORAL | Status: DC

## 2014-09-25 MED ORDER — DOXYCYCLINE HYCLATE 100 MG PO TABS: 100.0000 mg | ORAL_TABLET | Freq: Two times a day (BID) | ORAL | Status: DC

## 2014-09-25 MED ORDER — IPRATROPIUM-ALBUTEROL 0.5-2.5 (3) MG/3ML IN SOLN
3.0000 mL | Freq: Once | RESPIRATORY_TRACT | Status: AC
  Administered 2014-09-25: 3 mL via RESPIRATORY_TRACT

## 2014-09-25 NOTE — Progress Notes (Signed)
   Subjective:    Patient ID: Denise Miles, female    DOB: 1945-04-26, 69 y.o.   MRN: [id number]  HPI  Pt is a 69 yo female who presents to the clinic with her husband. She has had 2-3 weeks of cough, increased mucus production, SOB, and difficulty breathing. Sputum is yellowish green. No fever. Pt has COPD and on STiolto daily. She does not have albuterol inhaler so has been taking Stiolto bid.  Taken some mucinex with no relief.    Review of Systems  All other systems reviewed and are negative.      Objective:   Physical Exam  Constitutional: She is oriented to person, place, and time. She appears well-developed and well-nourished.  HENT:  Head: Normocephalic and atraumatic.  Right Ear: External ear normal.  Left Ear: External ear normal.  Nose: Nose normal.  Mouth/Throat: Oropharynx is clear and moist. No oropharyngeal exudate.  Eyes: Conjunctivae are normal. Right eye exhibits no discharge. Left eye exhibits no discharge.  Neck: Normal range of motion. Neck supple.  Cardiovascular: Normal rate, regular rhythm and normal heart sounds.   Pulmonary/Chest:  Pulse Ox 90 percent.  Bilateral high pitched wheezing and rhonchi.   Lymphadenopathy:    She has no cervical adenopathy.  Neurological: She is alert and oriented to person, place, and time.  Psychiatric: She has a normal mood and affect. Her behavior is normal.          Assessment & Plan:  COPD exacerbation- Duoneb given in office today. Increased Pulse Ox to 92 percent after nebulization. Will get CXR. Start doxycycline bid for 10 days. Prednisone taper given. Albuterol inhaler refilled for q4 hours for next few days then can decrease as improving. Follow up as needed.

## 2014-09-29 ENCOUNTER — Ambulatory Visit: Payer: Medicare Other | Admitting: Physician Assistant

## 2014-10-04 ENCOUNTER — Ambulatory Visit (INDEPENDENT_AMBULATORY_CARE_PROVIDER_SITE_OTHER): Payer: Medicare Other | Admitting: Physician Assistant

## 2014-10-04 VITALS — BP 135/82 | HR 55 | Ht 70.0 in | Wt 183.0 lb

## 2014-10-04 DIAGNOSIS — M5416 Radiculopathy, lumbar region: Secondary | ICD-10-CM | POA: Insufficient documentation

## 2014-10-04 DIAGNOSIS — Z13820 Encounter for screening for osteoporosis: Secondary | ICD-10-CM

## 2014-10-04 DIAGNOSIS — M5417 Radiculopathy, lumbosacral region: Secondary | ICD-10-CM

## 2014-10-04 DIAGNOSIS — Z1231 Encounter for screening mammogram for malignant neoplasm of breast: Secondary | ICD-10-CM

## 2014-10-04 MED ORDER — KETOROLAC TROMETHAMINE 30 MG/ML IJ SOLN
30.0000 mg | Freq: Once | INTRAMUSCULAR | Status: AC
  Administered 2014-10-04: 30 mg via INTRAMUSCULAR

## 2014-10-04 MED ORDER — METHYLPREDNISOLONE SODIUM SUCC 125 MG IJ SOLR
125.0000 mg | Freq: Once | INTRAMUSCULAR | Status: AC
  Administered 2014-10-04: 125 mg via INTRAMUSCULAR

## 2014-10-04 MED ORDER — MELOXICAM 7.5 MG PO TABS: ORAL_TABLET | ORAL | Status: DC

## 2014-10-04 NOTE — Progress Notes (Signed)
   Subjective:    Patient ID: Denise Miles, female    DOB: Jan 21, 1946, 69 y.o.   MRN: [id number]  HPI  Pt presents to the clinic with right lower leg numbness and tingling that seems to start behind knee and radiate into right lateral calf and lateral toes for last couple of weeks. She also has some back pain. Denies any bowel or bladder dysfunction or saddle anesthesia. Hx of abnormal MRI and some stenosis and and disc extrustion about 2 years ago. Not taking anything to make better. Walks with a cane. No pain in buttocks.     Review of Systems  All other systems reviewed and are negative.      Objective:   Physical Exam  Constitutional: She is oriented to person, place, and time. She appears well-developed and well-nourished.  HENT:  Head: Normocephalic and atraumatic.  Cardiovascular: Normal rate, regular rhythm and normal heart sounds.   Pulmonary/Chest: Effort normal and breath sounds normal.  Scattered rhonchi, bilaterally.   Musculoskeletal:  Some mild tenderness over lumbar spine and Paraspinous muscles.  Straight leg positive on right side with numbness seemlingly in the S1 distribution.   Neurological: She is alert and oriented to person, place, and time.  Skin: Skin is dry.  Psychiatric: She has a normal mood and affect. Her behavior is normal.          Assessment & Plan:  Lumbar back pain with right side radiculopathy- Last MRI impression below.   MRI- 2IMPRESSION:  1. Moderate spinal stenosis at L3-L4.  2. Left central disc extrusion L5-S1.  3. Moderately severe foraminal stenosis on the right at L5-S1.  4. Moderate foraminal stenoses on the left at L4-L5 and L5-S1 011.  Will order repeat MRI. Pt is just finishing prednisone taper for COPD exacerbation. Solumedrol '125mg'$  and toradol '30mg'$  IM given today. Mobic started 1-2 tablets daily for 2 weeks. Discussed warm compresses and exercises. Will consider PT.

## 2014-10-04 NOTE — Patient Instructions (Signed)
Low Back Sprain with Rehab  A sprain is an injury in which a ligament is torn. The ligaments of the lower back are vulnerable to sprains. However, they are strong and require great force to be injured. These ligaments are important for stabilizing the spinal column. Sprains are classified into three categories. Grade 1 sprains cause pain, but the tendon is not lengthened. Grade 2 sprains include a lengthened ligament, due to the ligament being stretched or partially ruptured. With grade 2 sprains there is still function, although the function may be decreased. Grade 3 sprains involve a complete tear of the tendon or muscle, and function is usually impaired. SYMPTOMS   Severe pain in the lower back.  Sometimes, a feeling of a "pop," "snap," or tear, at the time of injury.  Tenderness and sometimes swelling at the injury site.  Uncommonly, bruising (contusion) within 48 hours of injury.  Muscle spasms in the back. CAUSES  Low back sprains occur when a force is placed on the ligaments that is greater than they can handle. Common causes of injury include:  Performing a stressful act while off-balance.  Repetitive stressful activities that involve movement of the lower back.  Direct hit (trauma) to the lower back. RISK INCREASES WITH:  Contact sports (football, wrestling).  Collisions (major skiing accidents).  Sports that require throwing or lifting (baseball, weightlifting).  Sports involving twisting of the spine (gymnastics, diving, tennis, golf).  Poor strength and flexibility.  Inadequate protection.  Previous back injury or surgery (especially fusion). PREVENTION  Wear properly fitted and padded protective equipment.  Warm up and stretch properly before activity.  Allow for adequate recovery between workouts.  Maintain physical fitness:  Strength, flexibility, and endurance.  Cardiovascular fitness.  Maintain a healthy body weight. PROGNOSIS  If treated  properly, low back sprains usually heal with non-surgical treatment. The length of time for healing depends on the severity of the injury.  RELATED COMPLICATIONS   Recurring symptoms, resulting in a chronic problem.  Chronic inflammation and pain in the low back.  Delayed healing or resolution of symptoms, especially if activity is resumed too soon.  Prolonged impairment.  Unstable or arthritic joints of the low back. TREATMENT  Treatment first involves the use of ice and medicine, to reduce pain and inflammation. The use of strengthening and stretching exercises may help reduce pain with activity. These exercises may be performed at home or with a therapist. Severe injuries may require referral to a therapist for further evaluation and treatment, such as ultrasound. Your caregiver may advise that you wear a back brace or corset, to help reduce pain and discomfort. Often, prolonged bed rest results in greater harm then benefit. Corticosteroid injections may be recommended. However, these should be reserved for the most serious cases. It is important to avoid using your back when lifting objects. At night, sleep on your back on a firm mattress, with a pillow placed under your knees. If non-surgical treatment is unsuccessful, surgery may be needed.  MEDICATION   If pain medicine is needed, nonsteroidal anti-inflammatory medicines (aspirin and ibuprofen), or other minor pain relievers (acetaminophen), are often advised.  Do not take pain medicine for 7 days before surgery.  Prescription pain relievers may be given, if your caregiver thinks they are needed. Use only as directed and only as much as you need.  Ointments applied to the skin may be helpful.  Corticosteroid injections may be given by your caregiver. These injections should be reserved for the most serious cases,   because they may only be given a certain number of times. HEAT AND COLD  Cold treatment (icing) should be applied for 10  to 15 minutes every 2 to 3 hours for inflammation and pain, and immediately after activity that aggravates your symptoms. Use ice packs or an ice massage.  Heat treatment may be used before performing stretching and strengthening activities prescribed by your caregiver, physical therapist, or athletic trainer. Use a heat pack or a warm water soak. SEEK MEDICAL CARE IF:   Symptoms get worse or do not improve in 2 to 4 weeks, despite treatment.  You develop numbness or weakness in either leg.  You lose bowel or bladder function.  Any of the following occur after surgery: fever, increased pain, swelling, redness, drainage of fluids, or bleeding in the affected area.  New, unexplained symptoms develop. (Drugs used in treatment may produce side effects.) EXERCISES  RANGE OF MOTION (ROM) AND STRETCHING EXERCISES - Low Back Sprain Most people with lower back pain will find that their symptoms get worse with excessive bending forward (flexion) or arching at the lower back (extension). The exercises that will help resolve your symptoms will focus on the opposite motion.  Your physician, physical therapist or athletic trainer will help you determine which exercises will be most helpful to resolve your lower back pain. Do not complete any exercises without first consulting with your caregiver. Discontinue any exercises which make your symptoms worse, until you speak to your caregiver. If you have pain, numbness or tingling which travels down into your buttocks, leg or foot, the goal of the therapy is for these symptoms to move closer to your back and eventually resolve. Sometimes, these leg symptoms will get better, but your lower back pain may worsen. This is often an indication of progress in your rehabilitation. Be very alert to any changes in your symptoms and the activities in which you participated in the 24 hours prior to the change. Sharing this information with your caregiver will allow him or her to  most efficiently treat your condition. These exercises may help you when beginning to rehabilitate your injury. Your symptoms may resolve with or without further involvement from your physician, physical therapist or athletic trainer. While completing these exercises, remember:   Restoring tissue flexibility helps normal motion to return to the joints. This allows healthier, less painful movement and activity.  An effective stretch should be held for at least 30 seconds.  A stretch should never be painful. You should only feel a gentle lengthening or release in the stretched tissue. FLEXION RANGE OF MOTION AND STRETCHING EXERCISES: STRETCH - Flexion, Single Knee to Chest   Lie on a firm bed or floor with both legs extended in front of you.  Keeping one leg in contact with the floor, bring your opposite knee to your chest. Hold your leg in place by either grabbing behind your thigh or at your knee.  Pull until you feel a gentle stretch in your low back. Hold __________ seconds.  Slowly release your grasp and repeat the exercise with the opposite side. Repeat __________ times. Complete this exercise __________ times per day.  STRETCH - Flexion, Double Knee to Chest  Lie on a firm bed or floor with both legs extended in front of you.  Keeping one leg in contact with the floor, bring your opposite knee to your chest.  Tense your stomach muscles to support your back and then lift your other knee to your chest. Hold your legs   in place by either grabbing behind your thighs or at your knees.  Pull both knees toward your chest until you feel a gentle stretch in your low back. Hold __________ seconds.  Tense your stomach muscles and slowly return one leg at a time to the floor. Repeat __________ times. Complete this exercise __________ times per day.  STRETCH - Low Trunk Rotation  Lie on a firm bed or floor. Keeping your legs in front of you, bend your knees so they are both pointed toward the  ceiling and your feet are flat on the floor.  Extend your arms out to the side. This will stabilize your upper body by keeping your shoulders in contact with the floor.  Gently and slowly drop both knees together to one side until you feel a gentle stretch in your low back. Hold for __________ seconds.  Tense your stomach muscles to support your lower back as you bring your knees back to the starting position. Repeat the exercise to the other side. Repeat __________ times. Complete this exercise __________ times per day  EXTENSION RANGE OF MOTION AND FLEXIBILITY EXERCISES: STRETCH - Extension, Prone on Elbows   Lie on your stomach on the floor, a bed will be too soft. Place your palms about shoulder width apart and at the height of your head.  Place your elbows under your shoulders. If this is too painful, stack pillows under your chest.  Allow your body to relax so that your hips drop lower and make contact more completely with the floor.  Hold this position for __________ seconds.  Slowly return to lying flat on the floor. Repeat __________ times. Complete this exercise __________ times per day.  RANGE OF MOTION - Extension, Prone Press Ups  Lie on your stomach on the floor, a bed will be too soft. Place your palms about shoulder width apart and at the height of your head.  Keeping your back as relaxed as possible, slowly straighten your elbows while keeping your hips on the floor. You may adjust the placement of your hands to maximize your comfort. As you gain motion, your hands will come more underneath your shoulders.  Hold this position __________ seconds.  Slowly return to lying flat on the floor. Repeat __________ times. Complete this exercise __________ times per day.  RANGE OF MOTION- Quadruped, Neutral Spine   Assume a hands and knees position on a firm surface. Keep your hands under your shoulders and your knees under your hips. You may place padding under your knees for  comfort.  Drop your head and point your tailbone toward the ground below you. This will round out your lower back like an angry cat. Hold this position for __________ seconds.  Slowly lift your head and release your tail bone so that your back sags into a large arch, like an old horse.  Hold this position for __________ seconds.  Repeat this until you feel limber in your low back.  Now, find your "sweet spot." This will be the most comfortable position somewhere between the two previous positions. This is your neutral spine. Once you have found this position, tense your stomach muscles to support your low back.  Hold this position for __________ seconds. Repeat __________ times. Complete this exercise __________ times per day.  STRENGTHENING EXERCISES - Low Back Sprain These exercises may help you when beginning to rehabilitate your injury. These exercises should be done near your "sweet spot." This is the neutral, low-back arch, somewhere between fully rounded   and fully arched, that is your least painful position. When performed in this safe range of motion, these exercises can be used for people who have either a flexion or extension based injury. These exercises may resolve your symptoms with or without further involvement from your physician, physical therapist or athletic trainer. While completing these exercises, remember:   Muscles can gain both the endurance and the strength needed for everyday activities through controlled exercises.  Complete these exercises as instructed by your physician, physical therapist or athletic trainer. Increase the resistance and repetitions only as guided.  You may experience muscle soreness or fatigue, but the pain or discomfort you are trying to eliminate should never worsen during these exercises. If this pain does worsen, stop and make certain you are following the directions exactly. If the pain is still present after adjustments, discontinue the  exercise until you can discuss the trouble with your caregiver. STRENGTHENING - Deep Abdominals, Pelvic Tilt   Lie on a firm bed or floor. Keeping your legs in front of you, bend your knees so they are both pointed toward the ceiling and your feet are flat on the floor.  Tense your lower abdominal muscles to press your low back into the floor. This motion will rotate your pelvis so that your tail bone is scooping upwards rather than pointing at your feet or into the floor. With a gentle tension and even breathing, hold this position for __________ seconds. Repeat __________ times. Complete this exercise __________ times per day.  STRENGTHENING - Abdominals, Crunches   Lie on a firm bed or floor. Keeping your legs in front of you, bend your knees so they are both pointed toward the ceiling and your feet are flat on the floor. Cross your arms over your chest.  Slightly tip your chin down without bending your neck.  Tense your abdominals and slowly lift your trunk high enough to just clear your shoulder blades. Lifting higher can put excessive stress on the lower back and does not further strengthen your abdominal muscles.  Control your return to the starting position. Repeat __________ times. Complete this exercise __________ times per day.  STRENGTHENING - Quadruped, Opposite UE/LE Lift   Assume a hands and knees position on a firm surface. Keep your hands under your shoulders and your knees under your hips. You may place padding under your knees for comfort.  Find your neutral spine and gently tense your abdominal muscles so that you can maintain this position. Your shoulders and hips should form a rectangle that is parallel with the floor and is not twisted.  Keeping your trunk steady, lift your right hand no higher than your shoulder and then your left leg no higher than your hip. Make sure you are not holding your breath. Hold this position for __________ seconds.  Continuing to keep  your abdominal muscles tense and your back steady, slowly return to your starting position. Repeat with the opposite arm and leg. Repeat __________ times. Complete this exercise __________ times per day.  STRENGTHENING - Abdominals and Quadriceps, Straight Leg Raise   Lie on a firm bed or floor with both legs extended in front of you.  Keeping one leg in contact with the floor, bend the other knee so that your foot can rest flat on the floor.  Find your neutral spine, and tense your abdominal muscles to maintain your spinal position throughout the exercise.  Slowly lift your straight leg off the floor about 6 inches for a count   of 15, making sure to not hold your breath.  Still keeping your neutral spine, slowly lower your leg all the way to the floor. Repeat this exercise with each leg __________ times. Complete this exercise __________ times per day. POSTURE AND BODY MECHANICS CONSIDERATIONS - Low Back Sprain Keeping correct posture when sitting, standing or completing your activities will reduce the stress put on different body tissues, allowing injured tissues a chance to heal and limiting painful experiences. The following are general guidelines for improved posture. Your physician or physical therapist will provide you with any instructions specific to your needs. While reading these guidelines, remember:  The exercises prescribed by your provider will help you have the flexibility and strength to maintain correct postures.  The correct posture provides the best environment for your joints to work. All of your joints have less wear and tear when properly supported by a spine with good posture. This means you will experience a healthier, less painful body.  Correct posture must be practiced with all of your activities, especially prolonged sitting and standing. Correct posture is as important when doing repetitive low-stress activities (typing) as it is when doing a single heavy-load  activity (lifting). RESTING POSITIONS Consider which positions are most painful for you when choosing a resting position. If you have pain with flexion-based activities (sitting, bending, stooping, squatting), choose a position that allows you to rest in a less flexed posture. You would want to avoid curling into a fetal position on your side. If your pain worsens with extension-based activities (prolonged standing, working overhead), avoid resting in an extended position such as sleeping on your stomach. Most people will find more comfort when they rest with their spine in a more neutral position, neither too rounded nor too arched. Lying on a non-sagging bed on your side with a pillow between your knees, or on your back with a pillow under your knees will often provide some relief. Keep in mind, being in any one position for a prolonged period of time, no matter how correct your posture, can still lead to stiffness. PROPER SITTING POSTURE In order to minimize stress and discomfort on your spine, you must sit with correct posture. Sitting with good posture should be effortless for a healthy body. Returning to good posture is a gradual process. Many people can work toward this most comfortably by using various supports until they have the flexibility and strength to maintain this posture on their own. When sitting with proper posture, your ears will fall over your shoulders and your shoulders will fall over your hips. You should use the back of the chair to support your upper back. Your lower back will be in a neutral position, just slightly arched. You may place a small pillow or folded towel at the base of your lower back for  support.  When working at a desk, create an environment that supports good, upright posture. Without extra support, muscles tire, which leads to excessive strain on joints and other tissues. Keep these recommendations in mind: CHAIR:  A chair should be able to slide under your desk  when your back makes contact with the back of the chair. This allows you to work closely.  The chair's height should allow your eyes to be level with the upper part of your monitor and your hands to be slightly lower than your elbows. BODY POSITION  Your feet should make contact with the floor. If this is not possible, use a foot rest.  Keep your   ears over your shoulders. This will reduce stress on your neck and low back. INCORRECT SITTING POSTURES  If you are feeling tired and unable to assume a healthy sitting posture, do not slouch or slump. This puts excessive strain on your back tissues, causing more damage and pain. Healthier options include:  Using more support, like a lumbar pillow.  Switching tasks to something that requires you to be upright or walking.  Talking a brief walk.  Lying down to rest in a neutral-spine position. PROLONGED STANDING WHILE SLIGHTLY LEANING FORWARD  When completing a task that requires you to lean forward while standing in one place for a long time, place either foot up on a stationary 2-4 inch high object to help maintain the best posture. When both feet are on the ground, the lower back tends to lose its slight inward curve. If this curve flattens (or becomes too large), then the back and your other joints will experience too much stress, tire more quickly, and can cause pain. CORRECT STANDING POSTURES Proper standing posture should be assumed with all daily activities, even if they only take a few moments, like when brushing your teeth. As in sitting, your ears should fall over your shoulders and your shoulders should fall over your hips. You should keep a slight tension in your abdominal muscles to brace your spine. Your tailbone should point down to the ground, not behind your body, resulting in an over-extended swayback posture.  INCORRECT STANDING POSTURES  Common incorrect standing postures include a forward head, locked knees and/or an excessive  swayback. WALKING Walk with an upright posture. Your ears, shoulders and hips should all line-up. PROLONGED ACTIVITY IN A FLEXED POSITION When completing a task that requires you to bend forward at your waist or lean over a low surface, try to find a way to stabilize 3 out of 4 of your limbs. You can place a hand or elbow on your thigh or rest a knee on the surface you are reaching across. This will provide you more stability, so that your muscles do not tire as quickly. By keeping your knees relaxed, or slightly bent, you will also reduce stress across your lower back. CORRECT LIFTING TECHNIQUES DO :  Assume a wide stance. This will provide you more stability and the opportunity to get as close as possible to the object which you are lifting.  Tense your abdominals to brace your spine. Bend at the knees and hips. Keeping your back locked in a neutral-spine position, lift using your leg muscles. Lift with your legs, keeping your back straight.  Test the weight of unknown objects before attempting to lift them.  Try to keep your elbows locked down at your sides in order get the best strength from your shoulders when carrying an object.  Always ask for help when lifting heavy or awkward objects. INCORRECT LIFTING TECHNIQUES DO NOT:   Lock your knees when lifting, even if it is a small object.  Bend and twist. Pivot at your feet or move your feet when needing to change directions.  Assume that you can safely pick up even a paperclip without proper posture. Document Released: 01/13/2005 Document Revised: 04/07/2011 Document Reviewed: 04/27/2008 [employer] Patient Information 2015 [employer]. This information is not intended to replace advice given to you by your health care provider. Make sure you discuss any questions you have with your health care provider.  

## 2014-10-09 ENCOUNTER — Ambulatory Visit (INDEPENDENT_AMBULATORY_CARE_PROVIDER_SITE_OTHER): Payer: Medicare Other

## 2014-10-09 DIAGNOSIS — S32039A Unspecified fracture of third lumbar vertebra, initial encounter for closed fracture: Secondary | ICD-10-CM | POA: Diagnosis not present

## 2014-10-09 DIAGNOSIS — S32019A Unspecified fracture of first lumbar vertebra, initial encounter for closed fracture: Secondary | ICD-10-CM | POA: Diagnosis not present

## 2014-10-09 DIAGNOSIS — S32029A Unspecified fracture of second lumbar vertebra, initial encounter for closed fracture: Secondary | ICD-10-CM

## 2014-10-09 DIAGNOSIS — S22089A Unspecified fracture of T11-T12 vertebra, initial encounter for closed fracture: Secondary | ICD-10-CM

## 2014-10-09 DIAGNOSIS — X58XXXA Exposure to other specified factors, initial encounter: Secondary | ICD-10-CM

## 2014-10-11 ENCOUNTER — Ambulatory Visit (INDEPENDENT_AMBULATORY_CARE_PROVIDER_SITE_OTHER): Payer: Medicare Other | Admitting: Family Medicine

## 2014-10-11 ENCOUNTER — Encounter: Payer: Self-pay | Admitting: Family Medicine

## 2014-10-11 VITALS — BP 114/87 | HR 91 | Wt 182.0 lb

## 2014-10-11 DIAGNOSIS — R000 Tachycardia, unspecified: Secondary | ICD-10-CM

## 2014-10-11 DIAGNOSIS — M5417 Radiculopathy, lumbosacral region: Secondary | ICD-10-CM | POA: Diagnosis not present

## 2014-10-11 DIAGNOSIS — M5416 Radiculopathy, lumbar region: Secondary | ICD-10-CM

## 2014-10-11 NOTE — Progress Notes (Signed)
Subjective:    I'm seeing this patient as a consultation for:  Iran Planas PA-C   CC: Right leg numbness  HPI: Patient notes a around 6 weeks history of numbness along her right lower leg. She denies any new weakness or tingling. She denies any pain. No fevers chills nausea vomiting or diarrhea. She notes that she recently received a prednisone Dosepak for COPD exacerbation which did not help her leg symptoms. She denies any injury. Her PCT obtained a MRI which is listed below shoulder and multiple changes. She denies any significant back pain. She notes the numbness occurs on both the lateral and medial and plantar foot and lower leg.    Past medical history, Surgical history, Family history not pertinant except as noted below, Social history, Allergies, and medications have been entered into the medical record, reviewed, and no changes needed.   Review of Systems: No headache, visual changes, nausea, vomiting, diarrhea, constipation, dizziness, abdominal pain, skin rash, fevers, chills, night sweats, weight loss, swollen lymph nodes, body aches, joint swelling, muscle aches, chest pain, shortness of breath, mood changes, visual or auditory hallucinations.   Objective:     Filed Vitals:   10/11/14 1357 10/11/14 1430  BP: 114/87   Pulse: 120 91  Weight: 182 lb (82.555 kg)      heart rate on recheck 91 bpm  General: Well Developed, well nourished, and in no acute distress.  Neuro/Psych: Alert and oriented x3, extra-ocular muscles intact, able to move all 4 extremities, sensation grossly intact. Skin: Warm and dry, no rashes noted.  Respiratory: Not using accessory muscles, speaking in full sentences, trachea midline.  Cardiovascular: Pulses palpable, no extremity edema. Abdomen: Does not appear distended. MSK: Back nontender.  Strength bilateral lower extremities is intact and equal bilaterally. Reflexes are diminished but equal bilateral knees and ankles Sensation is normal  and intact on the left and slightly diminished on the right is on both the medial and lateral and dorsal and plantar foot and lower leg. Sensation is intact however the light touch.  Twelve-lead EKG shows normal sinus rhythm with PAC at 91 bpm. No significant ST elevation or depression. Flat and lateral precordial T waves. No prior study to compare to.  No results found for this or any previous visit (from the past 24 hour(s)). Mr Lumbar Spine Wo Contrast  10/09/2014   CLINICAL DATA:  Low back pain, numbness and tingling in the right leg for the past couple weeks. History of lumbar compression fractures.  EXAM: MRI LUMBAR SPINE WITHOUT CONTRAST  TECHNIQUE: Multiplanar, multisequence MR imaging of the lumbar spine was performed. No intravenous contrast was administered.  COMPARISON:  None.  FINDINGS: There are remote compression fractures of T12, L1, L2 and L3. Signal changes at L1 and T12 may suggest some overlying acute progressive compression. Normal overall alignment of the lumbar vertebral bodies. The conus medullaris terminates at L2. The facets are normally aligned. No pars defects.  No significant paraspinal or retroperitoneal findings.  T11-12: Mild retropulsion of T12 along with a bulging annulus with mild mass effect on the ventral thecal sac and narrowing of the ventral CSF space.  T12-L1: Mild retropulsion of T12 and L1 with mass effect on the ventral thecal sac and narrowing of the ventral CSF space. Mild foraminal narrowing bilaterally also.  L1-2: Bulging annulus and mild retropulsion of L1 with flattening of the ventral thecal sac and mild bilateral lateral recess stenosis. No significant foraminal stenosis.  L2-3: Diffuse annular bulge, short pedicles  and facet disease with mild spinal and bilateral lateral recess stenosis. No significant foraminal stenosis.  L3-4: Bulging degenerated annulus, osteophytic ridging, short pedicles and facet disease contributing to moderate spinal and bilateral  lateral recess stenosis. There is also mild bilateral foraminal stenosis, right greater than left.  L4-5: Diffuse annular bulge, osteophytic ridging and facet disease with mild bilateral lateral recess stenosis and mild bilateral foraminal encroachment.  L5-S1: Bulging annulus and moderate facet disease but no spinal or foraminal stenosis.  IMPRESSION: 1. Remote compression fractures of T12, L1, L2 and L3 with probable subacute superimposed further compression of T12 and L1. 2. Multilevel multifactorial spinal, lateral recess and foraminal stenosis as discussed above at the individual levels. The most significant level is L3-4.   Electronically Signed   By: Marijo Sanes M.D.   On: 10/09/2014 14:48    Impression and Recommendations:   This case required medical decision making of moderate complexity.

## 2014-10-11 NOTE — Patient Instructions (Addendum)
Thank you for coming in today. We will order a nerve conduction study to tell us where the problem is.  Return a few days after the test.  Return sooner if worsening.  Come back or go to the emergency room if you notice new weakness new numbness problems walking or bowel or bladder problems.  Follow-up with you primary care provider about her heart.  Sciatica Sciatica is pain, weakness, numbness, or tingling along your sciatic nerve. The nerve starts in the lower back and runs down the back of each leg. Nerve damage or certain conditions pinch or put pressure on the sciatic nerve. This causes the pain, weakness, and other discomforts of sciatica. HOME CARE   Only take medicine as told by your doctor.  Apply ice to the affected area for 20 minutes. Do this 3-4 times a day for the first 48-72 hours. Then try heat in the same way.  Exercise, stretch, or do your usual activities if these do not make your pain worse.  Go to physical therapy as told by your doctor.  Keep all doctor visits as told.  Do not wear high heels or shoes that are not supportive.  Get a firm mattress if your mattress is too soft to lessen pain and discomfort. GET HELP RIGHT AWAY IF:   You cannot control when you poop (bowel movement) or pee (urinate).  You have more weakness in your lower back, lower belly (pelvis), butt (buttocks), or legs.  You have redness or puffiness (swelling) of your back.  You have a burning feeling when you pee.  You have pain that gets worse when you lie down.  You have pain that wakes you from your sleep.  Your pain is worse than past pain.  Your pain lasts longer than 4 weeks.  You are suddenly losing weight without reason. MAKE SURE YOU:   Understand these instructions.  Will watch this condition.  Will get help right away if you are not doing well or get worse. Document Released: 10/23/2007 Document Revised: 07/15/2011 Document Reviewed: 05/25/2011 [facility] Patient  Information 2015 [place], [place]. This information is not intended to replace advice given to you by your health care provider. Make sure you discuss any questions you have with your health care provider.

## 2014-10-11 NOTE — Assessment & Plan Note (Signed)
Improved on recheck. EKG slightly abnormal but not tremendously abnormal. Plan to follow-up with PCP for this particular issue.

## 2014-10-11 NOTE — Addendum Note (Signed)
Addended by: Darla Lesches T on: 10/11/2014 03:11 PM   Modules accepted: Orders

## 2014-10-11 NOTE — Assessment & Plan Note (Signed)
Patient has lower leg numbness. I'm somewhat confused. She certainly has multiple changes on her lumbar MRI. She has changes at the L4 and L5 disc which may result 10 L4 nerve root symptoms. However she also has symptoms that make more sense for L5-S1 which does not have significant changes. At this point would obtain a nerve conduction study to assess for peripheral neuropathy versus nerve root impingement. Patient will return to clinic following test.

## 2014-10-19 ENCOUNTER — Encounter: Payer: Self-pay | Admitting: Osteopathic Medicine

## 2014-10-19 ENCOUNTER — Ambulatory Visit (INDEPENDENT_AMBULATORY_CARE_PROVIDER_SITE_OTHER): Payer: Medicare Other

## 2014-10-19 ENCOUNTER — Ambulatory Visit (INDEPENDENT_AMBULATORY_CARE_PROVIDER_SITE_OTHER): Payer: Medicare Other | Admitting: Osteopathic Medicine

## 2014-10-19 VITALS — BP 117/82 | HR 73 | Temp 98.4°F | Wt 185.0 lb

## 2014-10-19 DIAGNOSIS — R059 Cough, unspecified: Secondary | ICD-10-CM

## 2014-10-19 DIAGNOSIS — J441 Chronic obstructive pulmonary disease with (acute) exacerbation: Secondary | ICD-10-CM

## 2014-10-19 DIAGNOSIS — J42 Unspecified chronic bronchitis: Secondary | ICD-10-CM

## 2014-10-19 DIAGNOSIS — R05 Cough: Secondary | ICD-10-CM | POA: Diagnosis not present

## 2014-10-19 DIAGNOSIS — R928 Other abnormal and inconclusive findings on diagnostic imaging of breast: Secondary | ICD-10-CM | POA: Diagnosis not present

## 2014-10-19 DIAGNOSIS — J90 Pleural effusion, not elsewhere classified: Secondary | ICD-10-CM

## 2014-10-19 DIAGNOSIS — M4855XA Collapsed vertebra, not elsewhere classified, thoracolumbar region, initial encounter for fracture: Secondary | ICD-10-CM

## 2014-10-19 MED ORDER — PREDNISONE 20 MG PO TABS: 40.0000 mg | ORAL_TABLET | Freq: Every day | ORAL | Status: DC

## 2014-10-19 MED ORDER — ALBUTEROL SULFATE HFA 108 (90 BASE) MCG/ACT IN AERS: 2.0000 | INHALATION_SPRAY | Freq: Four times a day (QID) | RESPIRATORY_TRACT | Status: AC | PRN

## 2014-10-19 MED ORDER — AMOXICILLIN-POT CLAVULANATE 875-125 MG PO TABS: 1.0000 | ORAL_TABLET | Freq: Two times a day (BID) | ORAL | Status: DC

## 2014-10-19 NOTE — Progress Notes (Signed)
HPI: Denise Miles is a 69 y.o. female who presents to [facility]  today for chief complaint of:  Chief Complaint  Patient presents with  . Acute Visit    sinus issues    . Quality: pressure, drainage . Severity: moderate . Duration: 3 - 4 days, worse today . Timing: constant . Context: COPD exacerbation 1 month ago . Modifying factors: out of albuterol, taking other inhaler bid as directed . Assoc signs/symptoms: mild productive cough, no fever/chills, no dizziness   Past medical, social and family history reviewed: Past Medical History  Diagnosis Date  . COPD (chronic obstructive pulmonary disease)    Past Surgical History  Procedure Laterality Date  . Hip surgery Right 2013   Social History  Substance Use Topics  . Smoking status: Current Some Day Smoker  . Smokeless tobacco: Not on file  . Alcohol Use: No   Family History  Problem Relation Age of Onset  . Diabetes Mother   . Heart attack Father   . Alcohol abuse Sister   . Cancer Sister     lung  . Schizophrenia Sister     Current Outpatient Prescriptions  Medication Sig Dispense Refill  . albuterol (PROVENTIL HFA;VENTOLIN HFA) 108 (90 BASE) MCG/ACT inhaler Inhale 2 puffs into the lungs every 6 (six) hours as needed for wheezing or shortness of breath. 1 Inhaler 2  . atenolol (TENORMIN) 25 MG tablet Take 1 tablet (25 mg total) by mouth daily. 30 tablet 5  . cetirizine (ZYRTEC) 10 MG tablet Take 10 mg by mouth daily.    Marland Kitchen denosumab (PROLIA) 60 MG/ML SOLN injection Inject 60 mg into the skin every 6 (six) months. Administer in upper arm, thigh, or abdomen    . doxycycline (VIBRA-TABS) 100 MG tablet Take 1 tablet (100 mg total) by mouth 2 (two) times daily. 20 tablet 0  . furosemide (LASIX) 40 MG tablet Take 1-2 tablets daily as needed for peripheral edema. 30 tablet 0  . lovastatin (MEVACOR) 40 MG tablet Take 40 mg by mouth at bedtime.    . meloxicam (MOBIC) 7.5 MG tablet Take  1-2 tablets as needed daily. 60 tablet 0  . Multiple Vitamin (MULTIVITAMIN) tablet Take 1 tablet by mouth daily.    Marland Kitchen Plant Sterol Stanol-Pantethine (CHOLEST OFF COMPLETE PO) Take by mouth daily.    . potassium chloride (K-DUR,KLOR-CON) 10 MEQ tablet Take 1 tablet (10 mEq total) by mouth 2 (two) times daily. 60 tablet 2  . predniSONE (DELTASONE) 20 MG tablet Take 3 tablets for 3 days, then 2 tablets for 3 days, then 1 tablet for 3 days, then 1/2 tablet for 4 days. 21 tablet 0  . Tiotropium Bromide-Olodaterol (STIOLTO RESPIMAT) 2.5-2.5 MCG/ACT AERS Inhale 2 puffs into the lungs daily. 3 Inhaler 2  . triamcinolone (NASACORT ALLERGY 24HR) 55 MCG/ACT AERO nasal inhaler Place 2 sprays into the nose daily.    . Vitamin D, Ergocalciferol, (DRISDOL) 50000 UNITS CAPS capsule Take 50,000 Units by mouth every 7 (seven) days.     No current facility-administered medications for this visit.   Allergies  Allergen Reactions  . Latex Rash      Review of Systems: CONSTITUTIONAL: Neg fever/chills, no unintentional weight changes HEAD/EYES/EARS/NOSE/THROAT: No headache/vision change or hearing change, no sore throa (+) sinus pressure  CARDIAC: No chest pain/pressure/palpitations, no orthopnea RESPIRATORY: No shortness of breath/wheeze (+) cough as per HPI GASTROINTESTINAL: No nausea/vomiting/abdominal pain/blood in stool/diarrhea/constipation    Exam:  BP 117/82 mmHg  Pulse  73  Temp(Src) 98.4 F (36.9 C)  Wt 185 lb (83.915 kg)  SpO2 97% Constitutional: VSS, see above. General Appearance: alert, well-developed, well-nourished, NAD Eyes: Normal lids and conjunctive, non-icteric sclera, PERRLA Ears, Nose, Mouth, Throat: Normal external inspection ears/nares/mouth/lips/gums, Normal TM bilaterally, MMM, posterior pharynx without erythema/exudate Neck: No masses, trachea midline. No thyroid enlargement/tenderness/mass appreciated Respiratory: Normal respiratory effort. (+) rales/mild ronchi somewhat  worse worse on R, diminished breath sounds b/l at bases worse on L Cardiovascular: S1/S2 normal, no murmur/rub/gallop auscultated.   No results found for this or any previous visit (from the past 72 hour(s)).   CXR PA/LAT: reviewed personally, compared to CXR 08/2014, worse on L, some concern for whether elevated hemidiaphragm on L vs lobar PNA vs atelectasis vs effusion - called [facility] spoke to radiologist who advised this was effusion and recommended thoracentesis. Otherwise no significant concerns   ASSESSMENT/PLAN:  COPD exacerbation - Plan: amoxicillin-clavulanate (AUGMENTIN) 875-125 MG per tablet, predniSONE (DELTASONE) 20 MG tablet augmentin should cover sinusitis as well, pt had Doxy in 08/2014. Pt to continue Stiloto bid and refilled albuterol as below  Cough - Plan: DG Chest 2 View - see above re: interpretation  Pleural effusion - Plan: Ambulatory referral to Interventional Radiology   Chronic bronchitis, unspecified chronic bronchitis type - Plan: albuterol (PROVENTIL HFA;VENTOLIN HFA) 108 (90 BASE) MCG/ACT inhaler, DG Chest 2 View  Abnormal mammogram - Plan: MM Digital Diagnostic Bilat  ER precautions reviewed re: worsening breathing, cough, fever/weakness, other concerns. Pt to call us if hasn't heard today or tomorrow morning about referral for thoracentesis, pt clinically stable at this time so not an emergent procedure but she may certainly benefit from drainage of effusion.

## 2014-10-19 NOTE — Addendum Note (Signed)
Addended by: Jamesetta So on: 10/19/2014 04:28 PM   Modules accepted: Orders

## 2014-10-24 ENCOUNTER — Ambulatory Visit (HOSPITAL_COMMUNITY): Admission: RE | Admit: 2014-10-24 | Payer: Medicare Other | Source: Ambulatory Visit

## 2014-10-30 ENCOUNTER — Other Ambulatory Visit: Payer: Self-pay | Admitting: Physician Assistant

## 2014-10-30 ENCOUNTER — Ambulatory Visit (HOSPITAL_COMMUNITY): Payer: Medicare Other

## 2014-11-01 ENCOUNTER — Encounter: Payer: Self-pay | Admitting: Physician Assistant

## 2014-11-01 ENCOUNTER — Ambulatory Visit: Payer: Medicare Other | Admitting: Physician Assistant

## 2014-11-01 ENCOUNTER — Ambulatory Visit (INDEPENDENT_AMBULATORY_CARE_PROVIDER_SITE_OTHER): Payer: Medicare Other | Admitting: Physician Assistant

## 2014-11-01 ENCOUNTER — Encounter: Payer: Medicare Other | Admitting: Neurology

## 2014-11-01 VITALS — BP 114/79 | Ht 70.0 in | Wt 180.0 lb

## 2014-11-01 DIAGNOSIS — J42 Unspecified chronic bronchitis: Secondary | ICD-10-CM | POA: Diagnosis not present

## 2014-11-01 DIAGNOSIS — J90 Pleural effusion, not elsewhere classified: Secondary | ICD-10-CM

## 2014-11-01 DIAGNOSIS — J948 Other specified pleural conditions: Secondary | ICD-10-CM

## 2014-11-01 DIAGNOSIS — I480 Paroxysmal atrial fibrillation: Secondary | ICD-10-CM | POA: Diagnosis not present

## 2014-11-01 DIAGNOSIS — Z720 Tobacco use: Secondary | ICD-10-CM | POA: Diagnosis not present

## 2014-11-01 DIAGNOSIS — Z79899 Other long term (current) drug therapy: Secondary | ICD-10-CM

## 2014-11-01 DIAGNOSIS — J9601 Acute respiratory failure with hypoxia: Secondary | ICD-10-CM

## 2014-11-01 DIAGNOSIS — R609 Edema, unspecified: Secondary | ICD-10-CM

## 2014-11-01 MED ORDER — RIVAROXABAN 20 MG PO TABS: 20.0000 mg | ORAL_TABLET | Freq: Every day | ORAL | Status: DC

## 2014-11-01 NOTE — Patient Instructions (Signed)

## 2014-11-02 ENCOUNTER — Encounter: Payer: Self-pay | Admitting: Physician Assistant

## 2014-11-02 ENCOUNTER — Ambulatory Visit (INDEPENDENT_AMBULATORY_CARE_PROVIDER_SITE_OTHER): Payer: Medicare Other | Admitting: Physician Assistant

## 2014-11-02 VITALS — BP 107/68

## 2014-11-02 DIAGNOSIS — R600 Localized edema: Secondary | ICD-10-CM | POA: Insufficient documentation

## 2014-11-02 DIAGNOSIS — Z79899 Other long term (current) drug therapy: Secondary | ICD-10-CM

## 2014-11-02 DIAGNOSIS — I480 Paroxysmal atrial fibrillation: Secondary | ICD-10-CM

## 2014-11-02 DIAGNOSIS — R609 Edema, unspecified: Secondary | ICD-10-CM | POA: Diagnosis not present

## 2014-11-02 DIAGNOSIS — R928 Other abnormal and inconclusive findings on diagnostic imaging of breast: Secondary | ICD-10-CM

## 2014-11-02 DIAGNOSIS — E785 Hyperlipidemia, unspecified: Secondary | ICD-10-CM

## 2014-11-02 DIAGNOSIS — J42 Unspecified chronic bronchitis: Secondary | ICD-10-CM | POA: Diagnosis not present

## 2014-11-02 DIAGNOSIS — K5909 Other constipation: Secondary | ICD-10-CM

## 2014-11-02 DIAGNOSIS — J948 Other specified pleural conditions: Secondary | ICD-10-CM

## 2014-11-02 DIAGNOSIS — J90 Pleural effusion, not elsewhere classified: Secondary | ICD-10-CM | POA: Insufficient documentation

## 2014-11-02 DIAGNOSIS — J9601 Acute respiratory failure with hypoxia: Secondary | ICD-10-CM | POA: Insufficient documentation

## 2014-11-02 DIAGNOSIS — I959 Hypotension, unspecified: Secondary | ICD-10-CM

## 2014-11-02 LAB — COMPLETE METABOLIC PANEL WITH GFR
ALK PHOS: 64 U/L (ref 33–130)
ALT: 22 U/L (ref 6–29)
AST: 16 U/L (ref 10–35)
Albumin: 3.2 g/dL — ABNORMAL LOW (ref 3.6–5.1)
BILIRUBIN TOTAL: 0.8 mg/dL (ref 0.2–1.2)
BUN: 20 mg/dL (ref 7–25)
CHLORIDE: 101 mmol/L (ref 98–110)
CO2: 26 mmol/L (ref 20–31)
Calcium: 8.7 mg/dL (ref 8.6–10.4)
Creat: 0.67 mg/dL (ref 0.60–0.93)
GFR, EST NON AFRICAN AMERICAN: 89 mL/min (ref >=60)
GFR, Est African American: >89 mL/min (ref >=60)
Glucose, Bld: 69 mg/dL (ref 65–99)
POTASSIUM: 4.2 mmol/L (ref 3.5–5.3)
SODIUM: 137 mmol/L (ref 135–146)
Total Protein: 6.1 g/dL (ref 6.1–8.1)

## 2014-11-02 MED ORDER — LOVASTATIN 40 MG PO TABS: 40.0000 mg | ORAL_TABLET | Freq: Every day | ORAL | Status: DC

## 2014-11-02 NOTE — Progress Notes (Signed)
Subjective:    Patient ID: Denise Miles, female    DOB: 09/23/1945, 69 y.o.   MRN: [id number]  HPI  Patient is a 69 year old female who presents to the clinic with her husband for a hospital follow-up. Patient was admitted to the hospital on 10/25/2014 for shortness of breath and discharged home 10/30/2014. She had previously been seen in our clinic by Dr. Sheppard Coil on 10/19/14. Chest x-ray at that time revealed a left pleural effusion. She called and told patient to get set up for a thoracentesis. Patient never made it to that appointment but went to the hospital sooner for shortness of breath. She was diagnosed with paroxysmal atrial fibrillation, COPD exacerbation and acute respiratory failure with hypoxia. An echo was done on 10/26/14 and showed normal ejection fraction with 2+ mitral regurgitation and 1+ tricuspid regurgitation. Thoracentesis was performed. Chest x-ray was done on 10/30/2014 and showed resolving pleural effusion. Patient was sent home with a completely different med list. She declined anticoagulation and reported she would take a 325 aspirin. Numerous medications were stopped and new medications added. Diltiazem and flecainide were started for A. fib and BREO for COPD. she was sent home on 2 L of oxygen.  Patient is very confused with her medications. I'm not sure she is taking any other medications that the hospital sent her home with. She does feel very worn out and weak today. She does have some swelling of her bilateral lower extremities.    Review of Systems  All other systems reviewed and are negative.      Objective:   Physical Exam  Constitutional: She is oriented to person, place, and time. She appears well-nourished.  HENT:  Head: Normocephalic and atraumatic.  Cardiovascular: Normal heart sounds.   From [place] here in office heart rate ranged from 32 to 160. I did not get manually that line of the gap. She ranged from 60-130. Her heart rate was certainly a  irregular irregular rhythm.  Pulmonary/Chest:  Bilateral scattered rhonchi without any wheezing heard. Patient does seem to be slightly labored breathing with coarse breath sounds.  Neurological: She is alert and oriented to person, place, and time.  Skin:  1+ pitting peripheral bilateral edema of lower extremities.  Psychiatric: She has a normal mood and affect. Her behavior is normal.          Assessment & Plan:  I'm certainly not convinced patient is taking any of her medications correctly. We had a completely different med list and I'm concerned she is mixing medications. I asked patient to make another appointment tomorrow and bring in all her medications and inhalers. We will go over these one by one and make sure that they are correctly in her chart and she is taking them as directed.  Paroxysmal A. Fib- discussion was had about anticoagulation. I have convinced patient that she needs anticoagulation. The reason for not starting anticoagulation is scared of Coumadin. Discussed several toe and patient agreed to start. Patient aware to stop aspirin. I do think patient needs a cardiologist. Referral was made for Dr.Bohle on October 14 here in [place].  COPD exacerbation/acute respiratory failure with hypoxia/pleural effusion on left-pleural effusion was resolving after thoracentesis. Patient does seem to be feeling better and less productive cough. She is on 2 L of oxygen with a pulse ox of 94%. We took oxygen off and allowed her to sit at room air and her pulse ox dropped to 92%. When she became mobile her pulse ox dropped  around 90%. I discussed she can take breaks while she is sitting from oxygen but when she is ambulating she needs to wear oxygen. Will go over medications tomorrow. She is supposed to be on BREO from hospital. Pulmonology appointment was made for Dr. Dionne Milo for October 24 at 9:30 PM.  Peripheral lower extremity edema-patient encouraged to take Lasix as needed 40  mg a day for swelling. Encouraged to keep her feet elevated. If she takes Lasix she needs to take potassium with it. She reports they have the phone.  Tobacco abuse-long discussion about smoking cessation. Patient declined any help with smoking cessation. She reports she will stop on her own.

## 2014-11-02 NOTE — Patient Instructions (Signed)
Repeat CXR in 10th.   miralax 1 capful daily for constipation.  Consider probiotic.

## 2014-11-03 DIAGNOSIS — I959 Hypotension, unspecified: Secondary | ICD-10-CM | POA: Insufficient documentation

## 2014-11-03 NOTE — Progress Notes (Signed)
   Subjective:    Patient ID: Denise Miles, female    DOB: Jan 03, 1946, 69 y.o.   MRN: [id number]  HPI Pt presents with medication to follow up and make sure she is taking the right things. Pt continues to be weak and on O2 2L.    Review of Systems     Objective:   Physical Exam  Constitutional: She is oriented to person, place, and time.  Weak.   HENT:  Head: Normocephalic and atraumatic.  Cardiovascular:  Irregular irregular rhythm.   Pulmonary/Chest:  Scattered rhonchi bilaterally with coarse breath sounds.   Neurological: She is alert and oriented to person, place, and time.  Psychiatric: She has a normal mood and affect. Her behavior is normal.          Assessment & Plan:  A. Fib- discussed staying on diltiazem and fecainide. Per pt not been taking as directed.  Started xaralto yesterday. Cardiology follow up October 14th. cmp ordered for recheck.   COPD- pt not been taking BREO. Discussed to start one puff daily. Follow up with pulmonology on October 24th. Albuterol as needed. Continue on O2 2L with ambulation and not at rest. o2 stats look better today.   Left pleural effusion- recheck CXR Monday October 10th.   Hypotension- discussed to stay hydrated and eat salt to get BP up a tad. Continue to monitor. Call if below 100/70.   Peripheral edema- check BP before taking lasix and only take as needed. If take lasix take potassium with it.   Weakness- encouraged use of walker. Pt has one at home.   Hyperlipidemia- refilled lovastatin.   Constipation- discussed probiotic and OTC miralax. Follow up as needed.

## 2014-11-06 ENCOUNTER — Telehealth: Payer: Self-pay | Admitting: *Deleted

## 2014-11-06 NOTE — Telephone Encounter (Signed)
VO for PT for 2x per week for 6 weeks.Denise Miles [facility]

## 2014-11-07 ENCOUNTER — Telehealth: Payer: Self-pay

## 2014-11-07 NOTE — Telephone Encounter (Signed)
Dr Stanford Scotland would like to speak with Dr Madilyn Fireman about Denise Miles, the patient. Call back number is [phone]

## 2014-11-08 ENCOUNTER — Telehealth: Payer: Self-pay | Admitting: *Deleted

## 2014-11-08 NOTE — Telephone Encounter (Signed)
Pt's husband left vm yesterday stating that Denise Miles's legs and feet started swelling and he wanted to know if she could take the lasix.  I didn't get the vm until this morning so I called him.  He informed me that while the nurse was at their house yesterday she noticed the swelling so she listened to her lungs.  The nurse felt that she was getting fluid in her lungs again so she encouraged them to go to the ED.  They did and she was admitted.  Husband also let me know that they tested the fluid that was drained and it came back positive for cancer.  They don't know exactly where the cancer is originating from.  They did a brain MRI yesterday but haven't gotten the results back yet.  I asked him if they mentioned getting a PET scan and he didn't think so.  I asked him to call & keep me updated and to let me know if they needed [place] to do anything.

## 2014-11-08 NOTE — Telephone Encounter (Signed)
Called and spoke with hospitalist physician. Patient's cytology came back on the fluid that was drawn on October 3 positive for adenocarcinoma of the lung. She is Re: Had full CT scan of abdomen and pelvis and brain and the only suspicious area is the lung. She is likely going to have the fluid drawn off again today. And they have consult with Dr. duty Georgiann Cocker for oncology. Patient is aware of her diagnosis.  Beatrice Lecher, MD

## 2014-11-13 ENCOUNTER — Encounter: Payer: Self-pay | Admitting: Physician Assistant

## 2014-11-13 ENCOUNTER — Other Ambulatory Visit: Payer: Self-pay | Admitting: Physician Assistant

## 2014-11-13 DIAGNOSIS — C3492 Malignant neoplasm of unspecified part of left bronchus or lung: Secondary | ICD-10-CM | POA: Insufficient documentation

## 2014-11-13 MED ORDER — BISACODYL 10 MG RE SUPP: 10.0000 mg | RECTAL | Status: DC | PRN

## 2014-11-13 NOTE — Progress Notes (Unsigned)
Pt calls in with constipation for 5 days. Has hospital follow up tomorrow. Sent dulcolax suppositories. I would also recommend magnesium citrate 321m drinking for constipation. This is OTC.

## 2014-11-13 NOTE — Progress Notes (Signed)
Patient advised.

## 2014-11-14 ENCOUNTER — Encounter: Payer: Self-pay | Admitting: Physician Assistant

## 2014-11-14 ENCOUNTER — Ambulatory Visit (INDEPENDENT_AMBULATORY_CARE_PROVIDER_SITE_OTHER): Payer: Medicare Other | Admitting: Physician Assistant

## 2014-11-14 VITALS — BP 123/77 | HR 97 | Ht 70.0 in | Wt 182.0 lb

## 2014-11-14 DIAGNOSIS — C3492 Malignant neoplasm of unspecified part of left bronchus or lung: Secondary | ICD-10-CM

## 2014-11-14 DIAGNOSIS — K5900 Constipation, unspecified: Secondary | ICD-10-CM | POA: Diagnosis not present

## 2014-11-14 DIAGNOSIS — R609 Edema, unspecified: Secondary | ICD-10-CM | POA: Diagnosis not present

## 2014-11-14 MED ORDER — DILTIAZEM HCL ER BEADS 240 MG PO CP24: 240.0000 mg | ORAL_CAPSULE | Freq: Every day | ORAL | Status: DC

## 2014-11-14 MED ORDER — FUROSEMIDE 40 MG PO TABS: ORAL_TABLET | ORAL | Status: DC

## 2014-11-14 MED ORDER — POTASSIUM CHLORIDE CRYS ER 10 MEQ PO TBCR: 10.0000 meq | EXTENDED_RELEASE_TABLET | Freq: Two times a day (BID) | ORAL | Status: AC

## 2014-11-14 NOTE — Patient Instructions (Signed)
miralax 2 capfuls at night.

## 2014-11-19 NOTE — Progress Notes (Signed)
   Subjective:    Patient ID: Denise Miles, female    DOB: 11-01-1945, 69 y.o.   MRN: [id number]  HPI Pt presents to clinic for follow up after last hospital visit on 11/07/14. Another thoracentesis was complete and dx with adenocarcinoma of left lung. Dr. Georgiann Cocker saw pt in hospital. Pt on 2L of O2 daily. Pt is stable today. She is having a lot of constipation problems. She has had bowel movement this morning. Used duclolax sent yesterday. She continues to have some lower leg edema. She is not taking lasix at this time. She denies any worsening SOB, wheezing or cough.     Review of Systems  All other systems reviewed and are negative.      Objective:   Physical Exam  Constitutional: She is oriented to person, place, and time. She appears well-developed.  Appears weak.   HENT:  Head: Normocephalic and atraumatic.  Cardiovascular: Normal rate, regular rhythm and normal heart sounds.   Pulmonary/Chest: Effort normal and breath sounds normal.  Coarse breath sounds with scattered rhonchi.   Abdominal: Soft. She exhibits no distension and no mass. There is no tenderness. There is no rebound and no guarding.  Hypoactive bowel sounds.   Neurological: She is alert and oriented to person, place, and time.  Skin: Skin is dry.  1 + ankle edema(pitting)  Psychiatric: She has a normal mood and affect. Her behavior is normal.          Assessment & Plan:  Adenocarcinoma of left lung- pt has appt with Dr. Marilynn Rail for management.scheduled for PET scan next week. Continue on O2 until sees pulmonologist Dr. Michela Pitcher.   paroxysmal atrial fibrillation- on Xaralto daily. Stay on CCB. In rhythm today.   Constipation- dulcolax suppositories as needed. Start mucinex 1-2 capfuls at bedtime. Stay hydrated. Increase fiber.   Peripheral edema- start back lasix as needed and daily for next 4-5 days. Take potassium when taking lasix. Given bmp to check in 4 weeks.

## 2014-11-23 ENCOUNTER — Other Ambulatory Visit: Payer: Medicare Other

## 2014-11-23 ENCOUNTER — Ambulatory Visit: Payer: Medicare Other

## 2014-11-28 ENCOUNTER — Other Ambulatory Visit: Payer: Self-pay | Admitting: Physician Assistant

## 2014-11-28 ENCOUNTER — Encounter: Payer: Medicare Other | Admitting: Neurology

## 2014-12-01 ENCOUNTER — Encounter: Payer: Self-pay | Admitting: Physician Assistant

## 2014-12-01 DIAGNOSIS — C7951 Secondary malignant neoplasm of bone: Secondary | ICD-10-CM | POA: Insufficient documentation

## 2014-12-19 ENCOUNTER — Ambulatory Visit: Payer: Medicare Other | Admitting: Physician Assistant

## 2015-01-01 ENCOUNTER — Inpatient Hospital Stay: Payer: Medicare Other | Admitting: Physician Assistant

## 2015-01-09 ENCOUNTER — Other Ambulatory Visit: Payer: Self-pay | Admitting: Physician Assistant

## 2015-01-09 ENCOUNTER — Encounter: Payer: Self-pay | Admitting: Physician Assistant

## 2015-01-09 ENCOUNTER — Ambulatory Visit (INDEPENDENT_AMBULATORY_CARE_PROVIDER_SITE_OTHER): Payer: Medicare Other | Admitting: Physician Assistant

## 2015-01-09 VITALS — BP 93/61 | HR 85

## 2015-01-09 DIAGNOSIS — J948 Other specified pleural conditions: Secondary | ICD-10-CM | POA: Diagnosis not present

## 2015-01-09 DIAGNOSIS — C3492 Malignant neoplasm of unspecified part of left bronchus or lung: Secondary | ICD-10-CM

## 2015-01-09 DIAGNOSIS — R531 Weakness: Secondary | ICD-10-CM

## 2015-01-09 DIAGNOSIS — J90 Pleural effusion, not elsewhere classified: Secondary | ICD-10-CM

## 2015-01-09 DIAGNOSIS — J42 Unspecified chronic bronchitis: Secondary | ICD-10-CM

## 2015-01-09 DIAGNOSIS — R0602 Shortness of breath: Secondary | ICD-10-CM

## 2015-01-09 NOTE — Progress Notes (Addendum)
   Subjective:    Patient ID: Denise Miles, female    DOB: Aug 04, 1945, 69 y.o.   MRN: [id number]  HPI  Patient is a 69 year old female who presents to the clinic with her husband to follow-up after a thoracentesis of left lung yesterday 01/08/2015 from ongoing adenocarcinoma of left lung. Patient feels much better since she had the fluid drained off her lung. She is on average having thoracentesis every 2 weeks. This is a slightly better than what it was before starting chemotherapy. They're hoping that as she gets more more chemotherapy in her system the times between drawing fluid off will increase. She receives chemotherapy every 2 weeks via port. She does admit to be very short of breath. She is on 3 L of oxygen and pulse ox is 92% on average. She has very little ambulation. She has a wheelchair at home it is very hard to maneuver. She is very weak and short of breath and having trouble getting around the house. She would like a lightweight chair. She also sits most of the day creating areas of irritation and ulceration. She is not eating very well due to no appetite. She also has some incontience and wears pads.   Review of Systems  All other systems reviewed and are negative.      Objective:   Physical Exam  Constitutional: She is oriented to person, place, and time. She appears well-developed and well-nourished.  HENT:  Head: Normocephalic and atraumatic.  Cardiovascular: Normal rate and normal heart sounds.   Irregular irregular beat. Dx paroxysmal atrial fibrillation.   Pulmonary/Chest: No respiratory distress. She has no wheezes. She has no rales. She exhibits no tenderness.  On 3L of O2 and pulse ox 92 percent.  Coarse breath sounds some scattered rhonchi that cleared with coughing.   Neurological: She is alert and oriented to person, place, and time.  Psychiatric: She has a normal mood and affect. Her behavior is normal.          Assessment & Plan:  Adenocarcinoma of left  long/pleural effusion, left/shortness of breath/generalized weakness/COPD-certainly all these conditions overlap. Patient is on constant oxygen at 3 L. She is significantly weak. I did place an order for advanced wound care for her wheelchair. Certainly patientto drill when she begins to fill short of breath she can have fluid drained off left lung. She has advanced home health care that checks on her regularly. She is not taking her BREO daily I certainly think this could help prevent some of the COPD exacerbations. I would also add Mucinex 600 mg twice a day. This could help loosen her mucous secretions. Follow-up as needed  Placed order for [facility] for Gel overlay due to limited mobility due to weakness and SOB as complications from Lung Cancer. She also has some incontinence.

## 2015-01-09 NOTE — Patient Instructions (Signed)
mucinex twice a day.

## 2015-01-16 ENCOUNTER — Encounter: Payer: Self-pay | Admitting: Physician Assistant

## 2015-01-16 DIAGNOSIS — R32 Unspecified urinary incontinence: Secondary | ICD-10-CM | POA: Insufficient documentation

## 2015-01-24 ENCOUNTER — Encounter (INDEPENDENT_AMBULATORY_CARE_PROVIDER_SITE_OTHER): Payer: Self-pay | Admitting: Neurology

## 2015-01-24 ENCOUNTER — Ambulatory Visit (INDEPENDENT_AMBULATORY_CARE_PROVIDER_SITE_OTHER): Payer: Medicare Other | Admitting: Neurology

## 2015-01-24 DIAGNOSIS — M5416 Radiculopathy, lumbar region: Secondary | ICD-10-CM

## 2015-01-24 DIAGNOSIS — Z0289 Encounter for other administrative examinations: Secondary | ICD-10-CM

## 2015-01-24 DIAGNOSIS — M5417 Radiculopathy, lumbosacral region: Secondary | ICD-10-CM

## 2015-01-25 NOTE — Procedures (Signed)
   NCS (NERVE CONDUCTION STUDY) WITH EMG (ELECTROMYOGRAPHY) REPORT   STUDY DATE: January 25 2015 PATIENT NAME: Denise Miles DOB: December 19, 1945 MRN: [id number]    TECHNOLOGIST:  Laretta Alstrom ELECTROMYOGRAPHER: Marcial Pacas M.D.  CLINICAL INFORMATION:  69 year old female presenting with gradual onset bilateral lower extremity weakness, right worse than left, low back pain  On examination: Bilateral lower extremity pedal edema, bilateral ankle dorsiflexion weakness, right side is moderate, left side is mild.  She also has mild bilateral hip flexion weakness, right knee flexion weakness. Length dependent sensory changes, areflexia at bilateral lower extremity   FINDINGS: NERVE CONDUCTION STUDY: Bilateral peroneal sensory responses were absent.  Bilateral peroneal to EDB, and tibial motor responses were absent.  Bilateral tibial H reflexes were absent.  Left median, ulnar sensory and motor responses were normal.  NEEDLE ELECTROMYOGRAPHY: Selected needle examinations was performed at bilateral lower extremity muscles, and bilateral lumbar sacral paraspinal muscles.  Bilateral tibialis anterior: Increased insertional activity, no spontaneous activity, enlarge complex motor unit potential with decreased recruitment patterns  Bilateral tibialis posterior, peroneal longus: Normal insertion activity, no spontaneous activity, mildly enlarged complex motor unit potential with mildly decreased recruitment patterns  Bilateral vastus lateralis: Normal insertion activity, no spontaneous activity, complex enlarge motor unit potential with decreased recruitment patterns.  There was Increased insertional activity, 1 plus spontaneous activity at right L4-L5,  There was no spontaneous activity at left L4-5 S1.  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 Differentiation diagnosis includes bilateral lumbosacral radiculopathies with superimposed peripheral neuropathy, versus bilateral lumbar sacral plexopathies. The study is technically limited because patient's significant bilateral lower extremity pitting edema.  INTERPRETING PHYSICIAN:   Marcial Pacas M.D. Ph.D. [facility] [address] [place], [place] [address] [phone]

## 2015-01-25 NOTE — Progress Notes (Signed)
Quick Note:  Follow up with Dr Georgina Snell to discuss nerve conduction study ______

## 2015-02-06 ENCOUNTER — Other Ambulatory Visit: Payer: Self-pay | Admitting: Physician Assistant

## 2015-02-14 ENCOUNTER — Ambulatory Visit (INDEPENDENT_AMBULATORY_CARE_PROVIDER_SITE_OTHER): Payer: Medicare Other | Admitting: Physician Assistant

## 2015-02-14 ENCOUNTER — Encounter: Payer: Self-pay | Admitting: Physician Assistant

## 2015-02-14 VITALS — BP 83/59 | HR 127 | Ht 70.0 in | Wt 184.0 lb

## 2015-02-14 DIAGNOSIS — R600 Localized edema: Secondary | ICD-10-CM | POA: Diagnosis not present

## 2015-02-14 DIAGNOSIS — C3492 Malignant neoplasm of unspecified part of left bronchus or lung: Secondary | ICD-10-CM | POA: Diagnosis not present

## 2015-02-14 DIAGNOSIS — R000 Tachycardia, unspecified: Secondary | ICD-10-CM

## 2015-02-14 DIAGNOSIS — I959 Hypotension, unspecified: Secondary | ICD-10-CM

## 2015-02-14 DIAGNOSIS — J948 Other specified pleural conditions: Secondary | ICD-10-CM

## 2015-02-14 DIAGNOSIS — J90 Pleural effusion, not elsewhere classified: Secondary | ICD-10-CM

## 2015-02-14 NOTE — Progress Notes (Signed)
   Subjective:    Patient ID: Denise Miles, female    DOB: 06-Oct-1945, 70 y.o.   MRN: [id number]  HPI Patient is a 70 year old female with adenocarcinoma of left lung and chronic atrial fibrillation who presents to the clinic for 3 month follow-up. She comes in today feeling a little weak and short of breath. She has no other concerns. She is currently under Dr. Georgiann Cocker care for her cancer. She gets reoccurring pleural effusions. Her last drainage was 02/05/2015. 1100 mL were drained at that time. She denies any fever or chills. She does report worsening bilateral edema. She was told by her oncologist not to take any Lasix.  Review of Systems  All other systems reviewed and are negative.      Objective:   Physical Exam  Constitutional: She is oriented to person, place, and time.  Weak, fraile, pale.   HENT:  Head: Normocephalic and atraumatic.  Cardiovascular: Normal heart sounds.   Irregular irregular rhythm.   Pulmonary/Chest:  Decreased effort. Coarse breath sounds.  Left lower lung crackles and rales.   Neurological: She is alert and oriented to person, place, and time.  Skin:  Non-pitting edema bilateral ankles into calves.   Psychiatric: She has a normal mood and affect. Her behavior is normal.          Assessment & Plan:  Hypertension/tachycardia/bilateral edema/adenocarcinoma of left lung/pleural effusion- patient's blood pressure was 83/59, pulse rate 127, pulse ox 95% at 4 L. She had bilateral non pitting edema. With all of these symptoms had no choice but to send her to emergency room for workup for CHF and possible need for drainage of pleural effusion of left lung. Will refill medications as she needs them.

## 2015-02-19 ENCOUNTER — Telehealth: Payer: Self-pay

## 2015-02-19 NOTE — Telephone Encounter (Signed)
Patient's husband advised. Denise Miles with [facility] advised. I left a message for Dr Georgiann Cocker.

## 2015-02-19 NOTE — Telephone Encounter (Signed)
Can we please call Dr. Georgiann Cocker office. I try increasing lasix but pt's husbands states that she does not need to take lasix. Call office to find out what to do when fluid accumulates if she does not want [place] to use lasix.   Increase to '40mg'$  twice a day for next 2-3 days then go back down or until we find out from Dr. Georgiann Cocker.   Please recheck lungs in morning and if worsening may need to go back to get more fluid drained off lungs.

## 2015-02-19 NOTE — Telephone Encounter (Signed)
Denise Miles with [facility] called. Patient's lungs sounds congested, she has stage 1 pressure ulcer and bilateral 2+ edema in feet. She is taking 40 mg daily of Lasix. Do you think she needs to increase her lasix, per Denise Miles?  [phone] husband's number

## 2015-02-20 ENCOUNTER — Telehealth: Payer: Self-pay

## 2015-02-20 ENCOUNTER — Telehealth: Payer: Self-pay | Admitting: Neurology

## 2015-02-20 NOTE — Telephone Encounter (Signed)
Send to ER

## 2015-02-20 NOTE — Telephone Encounter (Signed)
Dr Georgiann Cocker states the last time patient was seen she had advised her to take 40 mg bid, of lasix.

## 2015-02-20 NOTE — Telephone Encounter (Signed)
FYI: The sleep lab has an oxygen concentrator that the patient can use. Denise Miles said that she can pull it into the patient's room and hook her up for [place]. Appt is 9am on Thursday. Husband is aware.

## 2015-02-20 NOTE — Telephone Encounter (Signed)
I spoke to husband, they want to know if we have O2 that they can use during visit. I advised him that I will call back with answer.

## 2015-02-20 NOTE — Telephone Encounter (Signed)
Husband advised. 

## 2015-02-20 NOTE — Telephone Encounter (Signed)
Patient husband is wondering if we could have some protable oxygen for his wife because she is running low about 4 liters. The best number to contact is [phone]

## 2015-02-20 NOTE — Telephone Encounter (Signed)
Daily twice a day.

## 2015-02-20 NOTE — Telephone Encounter (Signed)
Rachael with hospice called and reports 4 + edema bilateral feet and diminished lung sounds. Please advise.

## 2015-02-20 NOTE — Telephone Encounter (Signed)
Should she take it daily from now on or just for 3 days?

## 2015-02-20 NOTE — Telephone Encounter (Signed)
Ok make pt aware she is ok with this dose.

## 2015-02-21 NOTE — Telephone Encounter (Signed)
Patient was advised and sent yesterday.

## 2015-02-22 ENCOUNTER — Ambulatory Visit (INDEPENDENT_AMBULATORY_CARE_PROVIDER_SITE_OTHER): Payer: Medicare Other | Admitting: Neurology

## 2015-02-22 ENCOUNTER — Encounter: Payer: Self-pay | Admitting: Neurology

## 2015-02-22 VITALS — BP 92/60 | HR 58

## 2015-02-22 DIAGNOSIS — M4806 Spinal stenosis, lumbar region: Secondary | ICD-10-CM | POA: Diagnosis not present

## 2015-02-22 DIAGNOSIS — C3492 Malignant neoplasm of unspecified part of left bronchus or lung: Secondary | ICD-10-CM

## 2015-02-22 DIAGNOSIS — J90 Pleural effusion, not elsewhere classified: Secondary | ICD-10-CM

## 2015-02-22 DIAGNOSIS — I480 Paroxysmal atrial fibrillation: Secondary | ICD-10-CM | POA: Diagnosis not present

## 2015-02-22 DIAGNOSIS — J948 Other specified pleural conditions: Secondary | ICD-10-CM | POA: Diagnosis not present

## 2015-02-22 DIAGNOSIS — M48061 Spinal stenosis, lumbar region without neurogenic claudication: Secondary | ICD-10-CM | POA: Insufficient documentation

## 2015-02-22 NOTE — Progress Notes (Signed)
PATIENT: Denise Miles DOB: [birth year]  Chief Complaint  Patient presents with  . Numbness    She is here with her husband, [name].  She has numbness in her hands and legs.  She is here to disucss the results of her NCV/EMG and treatment options.     HISTORICAL  Denise Miles is a 70 years old right-handed female, accompanied by her husband [name], seen in refer by her primary care physician Donella Stade in February 22 2015 for evaluation of numbness tingling in her hands, and legs, difficulty walking  I reviewed and summarized the note from her oncologist Dr. Verdell Carmine from [facility]  She was found to have malignant pleural effusion on chest x-ray in August 2016,  CT of the chest 11/07/14 showed a left pleural effusion with a pleural nodule and left hilar mediastinal adenopathy as well as a lesion in the left adrenal gland 10/30/14 thoracentesis revealed adenocarcinoma of presumed lung primary; 11/21/14 PET/CT scan confirmed to bone metastases in the right superior acetabulum along with bilateral supraclavicular hilar and thoracic adenopathy and a pleural-based nodule underneath her effusion; clinical stage [id number] stage IV  MRI of the brain 11/07/14 normal MRI of the spine showed compression fractures of T12, L1-L2 and L3.  She is now receiving chemotherapy, she also suffers COPD, become oxygen dependent since October 2016, gradual worsening gait difficulty, now wheelchair bound for few months. I met her initially during electrodiagnostic study in Jan 25 2015: which showed evidence of chronic bilateral lumbosacral radiculopathy, right worse than left.   She now complains of severe low back pain, right hip pain, had progressive worsening urinary bowel incontinence since July 2016, bilateral lower extremity numbness, right worse than left, below knee level.   We have reviewed MRI of lumbar spine in September 2016: 1. Remote compression fractures of T12, L1, L2 and L3 with  probable subacute superimposed further compression of T12 and L1.  Multilevel multifactorial spinal, lateral recess and foraminal stenosis as discussed above at the individual levels. The most significant level is L3-4.  REVIEW OF SYSTEMS: Full 14 system review of systems performed and notable only for back pain, right hip pain, gait difficulty, leg swelling  ALLERGIES: Allergies  Allergen Reactions  . Latex Rash    HOME MEDICATIONS: Current Outpatient Prescriptions  Medication Sig Dispense Refill  . albuterol (PROVENTIL HFA;VENTOLIN HFA) 108 (90 BASE) MCG/ACT inhaler Inhale 2 puffs into the lungs every 6 (six) hours as needed for wheezing or shortness of breath. 1 Inhaler 2  . cetirizine (ZYRTEC) 10 MG tablet Take 10 mg by mouth daily.    Marland Kitchen denosumab (PROLIA) 60 MG/ML SOLN injection Inject 60 mg into the skin every 6 (six) months. Administer in upper arm, thigh, or abdomen    . diltiazem (TIAZAC) 360 MG 24 hr capsule Take 360 mg by mouth daily.    . flecainide (TAMBOCOR) 100 MG tablet TAKE ONE TABLET (100 MG TOTAL) BY MOUTH EVERY 12 (TWELVE) HOURS. 180 tablet 1  . Fluticasone Furoate-Vilanterol (BREO ELLIPTA) 200-25 MCG/INH AEPB Inhale 1 puff into the lungs.    . furosemide (LASIX) 40 MG tablet TAKE 1-2 TABLETS DAILY AS NEEDED FOR PERIPHERAL EDEMA. 90 tablet 1  . lovastatin (MEVACOR) 40 MG tablet TAKE 1 TABLET BY MOUTH AT BEDTIME 90 tablet 0  . meloxicam (MOBIC) 7.5 MG tablet TAKE 1-2 TABLETS AS NEEDED DAILY. 60 tablet 5  . Plant Sterol Stanol-Pantethine (CHOLEST OFF COMPLETE PO) Take by mouth daily.    Marland Kitchen  potassium chloride (K-DUR,KLOR-CON) 10 MEQ tablet Take 1 tablet (10 mEq total) by mouth 2 (two) times daily. Only as needed and taken with lasix. 60 tablet 2  . triamcinolone (NASACORT ALLERGY 24HR) 55 MCG/ACT AERO nasal inhaler Place 2 sprays into the nose daily.    Alveda Reasons 20 MG TABS tablet TAKE 1 TABLET (20 MG TOTAL) BY MOUTH DAILY WITH SUPPER. 30 tablet 2   No current  facility-administered medications for this visit.    PAST MEDICAL HISTORY: Past Medical History  Diagnosis Date  . COPD (chronic obstructive pulmonary disease) ([facility])   . Lung cancer ([facility])   . Bone cancer ([facility])     PAST SURGICAL HISTORY: Past Surgical History  Procedure Laterality Date  . Hip surgery Right 2013  . Portacath placement      FAMILY HISTORY: Family History  Problem Relation Age of Onset  . Diabetes Mother   . Heart attack Father   . Alcohol abuse Sister   . Cancer Sister     lung  . Schizophrenia Sister     SOCIAL HISTORY:  Social History   Social History  . Marital Status: Married    Spouse Name: N/A  . Number of Children: 2  . Years of Education: N/A   Occupational History  . Retired    Social History Main Topics  . Smoking status: Former Research scientist (life sciences)  . Smokeless tobacco: Not on file  . Alcohol Use: No  . Drug Use: No  . Sexual Activity: Not on file   Other Topics Concern  . Not on file   Social History Narrative   Lives at home with her husband.   Right-handed.     PHYSICAL EXAM   Filed Vitals:   02/22/15 0904  BP: 92/60  Pulse: 58    Not recorded      There is no weight on file to calculate BMI.  PHYSICAL EXAMNIATION:  Gen: NAD, conversant, well nourised, obese, well groomed                     Cardiovascular: Regular rate rhythm, no peripheral edema, warm, nontender. Eyes: Conjunctivae clear without exudates or hemorrhage Neck: Supple, no carotid bruise. Pulmonary: Clear to auscultation bilaterally   NEUROLOGICAL EXAM:  MENTAL STATUS: Speech:    Speech is normal; fluent and spontaneous with normal comprehension.  Cognition:     Orientation to time, place and person     Normal recent and remote memory     Normal Attention span and concentration     Normal Language, naming, repeating,spontaneous speech     Fund of knowledge   CRANIAL NERVES: CN II: Visual fields are full to confrontation. Pupils are round equal and  briskly reactive to light. CN III, IV, VI: extraocular movement are normal. No ptosis. CN V: Facial sensation is intact to pinprick in all 3 divisions bilaterally. Corneal responses are intact.  CN VII: Face is symmetric with normal eye closure and smile. CN VIII: Hearing is normal to rubbing fingers CN IX, X: Palate elevates symmetrically. Phonation is normal. CN XI: Head turning and shoulder shrug are intact CN XII: Tongue is midline with normal movements and no atrophy.  MOTOR: She sits in wheelchair, with nasal oxygen There is no pronator drift of out-stretched arms. Muscle bulk and tone strength normal at bilateral upper extremities, she has normal muscle tone bulk at bilateral lower extremity, significant weakness at bilateral lower extremity, which is also limited by low back pain, right hip  pain, bilateral hip flexion 2/2, knee flexion 4/4, knee extension 4/5, ankle dorsiflexion 4/4   REFLEXES: Reflexes are absent at bilateral lower extremity, plantar responses are flexor bilaterally.   SENSORY: Length dependent decreased light touch pinprick vibratory sensation to knee level.   COORDINATION: Rapid alternating movements and fine finger movements are intact. There is no dysmetria on finger-to-nose  GAIT/STANCE: Deferred   DIAGNOSTIC DATA (LABS, IMAGING, TESTING) - I reviewed patient records, labs, notes, testing and imaging myself where available.   ASSESSMENT AND PLAN  Sherriann Szuch is a 70 y.o. female    Stage IV metastatic lung cancer  Moderate to severe lumbar stenosis   this were explained her complains of low back pain, progressive bilateral lower extremity paresthesia, lower extremity weakness, gait difficulty,  I explained the findings to patient and her husband, they voiced understanding   Marcial Pacas, M.D. Ph.D.  [facility] [address] [place], [place] [address] Ph: [phone] Fax: [phone]  CC: Donella Stade,  PA-C

## 2015-02-23 ENCOUNTER — Ambulatory Visit: Payer: Medicare Other | Admitting: Sports Medicine

## 2015-02-23 ENCOUNTER — Encounter: Payer: Self-pay | Admitting: Physician Assistant

## 2015-02-23 ENCOUNTER — Ambulatory Visit (INDEPENDENT_AMBULATORY_CARE_PROVIDER_SITE_OTHER): Payer: Medicare Other | Admitting: Physician Assistant

## 2015-02-23 VITALS — BP 91/64 | HR 75

## 2015-02-23 DIAGNOSIS — F329 Major depressive disorder, single episode, unspecified: Secondary | ICD-10-CM | POA: Diagnosis not present

## 2015-02-23 DIAGNOSIS — M48061 Spinal stenosis, lumbar region without neurogenic claudication: Secondary | ICD-10-CM

## 2015-02-23 DIAGNOSIS — R208 Other disturbances of skin sensation: Secondary | ICD-10-CM

## 2015-02-23 DIAGNOSIS — M4806 Spinal stenosis, lumbar region: Secondary | ICD-10-CM

## 2015-02-23 DIAGNOSIS — F32A Depression, unspecified: Secondary | ICD-10-CM

## 2015-02-23 DIAGNOSIS — R200 Anesthesia of skin: Secondary | ICD-10-CM | POA: Insufficient documentation

## 2015-02-23 MED ORDER — RIVAROXABAN 20 MG PO TABS: ORAL_TABLET | ORAL | Status: AC

## 2015-02-23 MED ORDER — FUROSEMIDE 40 MG PO TABS: ORAL_TABLET | ORAL | Status: AC

## 2015-02-23 MED ORDER — SERTRALINE HCL 50 MG PO TABS: 50.0000 mg | ORAL_TABLET | Freq: Every day | ORAL | Status: AC

## 2015-02-23 MED ORDER — LOVASTATIN 40 MG PO TABS: 40.0000 mg | ORAL_TABLET | Freq: Every day | ORAL | Status: AC

## 2015-02-23 MED ORDER — FLECAINIDE ACETATE 100 MG PO TABS: ORAL_TABLET | ORAL | Status: AC

## 2015-02-23 NOTE — Telephone Encounter (Signed)
Call Dr. Georgiann Cocker oncology and see if she has any problems with gabapentin for bilateral lower extremity numbness and tingling from lumbar stenosis. She was seen by neurologist at [facility] but they did nothing and said for her to complete cancer treatment and then follow up.

## 2015-02-23 NOTE — Telephone Encounter (Signed)
Spoke with Gwinda Passe from Dr. Georgiann Cocker office regarding gabapentin.  She is going to call me back.

## 2015-02-23 NOTE — Progress Notes (Signed)
   Subjective:    Patient ID: Denise Miles, female    DOB: 1945-12-12, 70 y.o.   MRN: [id number]  HPI Patient is a 70 year old female who presents to the clinic with her husband. She is undergoing treatment for adenocarcinoma of left long. She is managed by Dr. Georgiann Cocker. She is here for follow-up. She saw a neurologist yesterday for lumbar stenosis with bilateral tingling of lower extremities. They did not do anything for her symptoms. Continues to have this tingling pain. She wonders if there is anything we can do. When we talk about her mood she says she is okay but she is "down and pissed off at this disease". The last time she had any fluid drained off her longest was 02/15/2015. Per her oncologist recommended only having drained when patient is short of breath not abnormal lung exam.    Review of Systems  All other systems reviewed and are negative.      Objective:   Physical Exam  Constitutional: She is oriented to person, place, and time. She appears well-developed and well-nourished.  HENT:  Head: Normocephalic and atraumatic.  Cardiovascular: Normal rate, regular rhythm and normal heart sounds.   Pulmonary/Chest:  On 4L of O2.  Scattered crackles over left lung base.  No wheezing.   Neurological: She is alert and oriented to person, place, and time.  Skin: Skin is dry.  Psychiatric: She has a normal mood and affect. Her behavior is normal.          Assessment & Plan:  Adenocarcinoma/bilateral pleural effusion-managed by Dr. Georgiann Cocker. Last pleural infusion drainage was 02/15/15. Per Dr. Georgiann Cocker only drain when pt is SOB. Vitals stable today.   Depression- certainly she is going through a lot with her lung cancer. PHQ-9 was 5. Not overwhelming high numbers. Think she would benefit from medication. Start zoloft 1/2 tablet for one week then increase to one full tab. SE's discussed. Follow up in 2-3 months.   Spinal stenosis of lumbar spine/bilateral numbness and tingling-  neurologist did not give or make any treatment plans. Will consult Dr. Georgiann Cocker and see if she has any aversion to starting gabapentin. If not will start '100mg'$  and increase slowly to three times a day.

## 2015-02-26 ENCOUNTER — Other Ambulatory Visit: Payer: Self-pay | Admitting: *Deleted

## 2015-02-26 MED ORDER — GABAPENTIN 100 MG PO CAPS: ORAL_CAPSULE | ORAL | Status: AC

## 2015-02-26 NOTE — Telephone Encounter (Signed)
Betsy from Dr. Georgiann Cocker office called back & stated that it is okay for pt to take gabapentin.

## 2015-02-26 NOTE — Telephone Encounter (Signed)
Ok please call pt and send over gabapentin '100mg'$  at bedtime and increase every 3 days by '100mg'$  until gets to three times a day. #90 3 refills.

## 2015-02-28 ENCOUNTER — Encounter: Payer: Self-pay | Admitting: Physician Assistant

## 2015-02-28 NOTE — Progress Notes (Signed)
Quick Note:  Nerve conduction study is back. If you would like further evaluation of this issue return with me to discuss results further. ______

## 2015-03-23 ENCOUNTER — Other Ambulatory Visit: Payer: Self-pay | Admitting: Physician Assistant

## 2015-03-28 DEATH — deceased

## 2015-08-29 ENCOUNTER — Other Ambulatory Visit: Payer: Self-pay | Admitting: Physician Assistant

## 2016-05-09 IMAGING — MR MR LUMBAR SPINE W/O CM
4 of 5 series · 23 of 48 positions shown · non-contrast
Comparison: None.

CLINICAL DATA: Low back pain, numbness and tingling in the right
leg for the past couple weeks. History of lumbar compression
fractures.

EXAM:
MRI LUMBAR SPINE WITHOUT CONTRAST
TECHNIQUE: Multiplanar, multisequence MR imaging of the lumbar spine was
performed. No intravenous contrast was administered.

[Series 3: T2 · sagittal · 4.0mm · 0.81mm/px · 6 of 19 slices shown (1 of 2)]
[im 1/19]
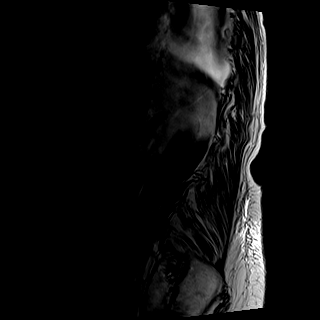
[im 4/19]
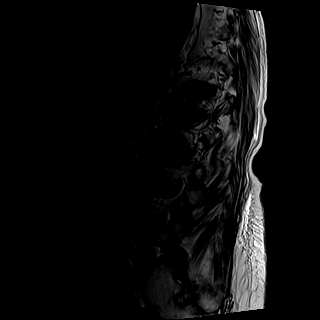
[im 8/19]
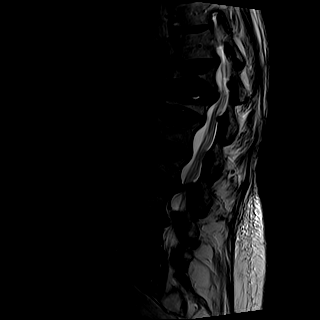
[im 11/19]
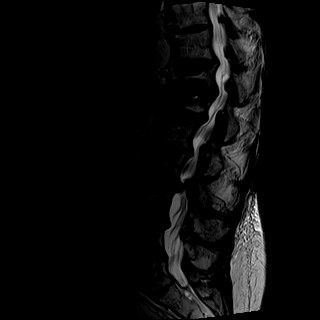
[im 15/19]
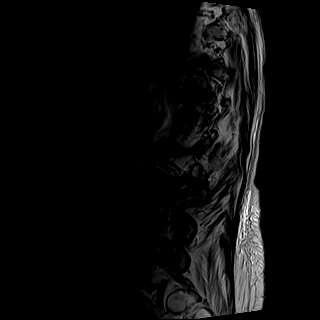
[im 19/19]
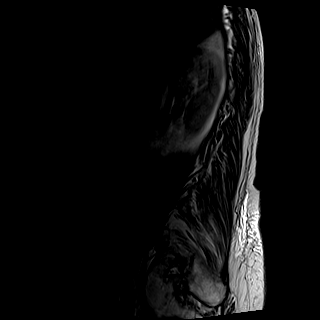

[Series 4: T1 · sagittal · 4.0mm · 0.41mm/px · 6 of 19 slices shown (1 of 2)]
[im 1/19]
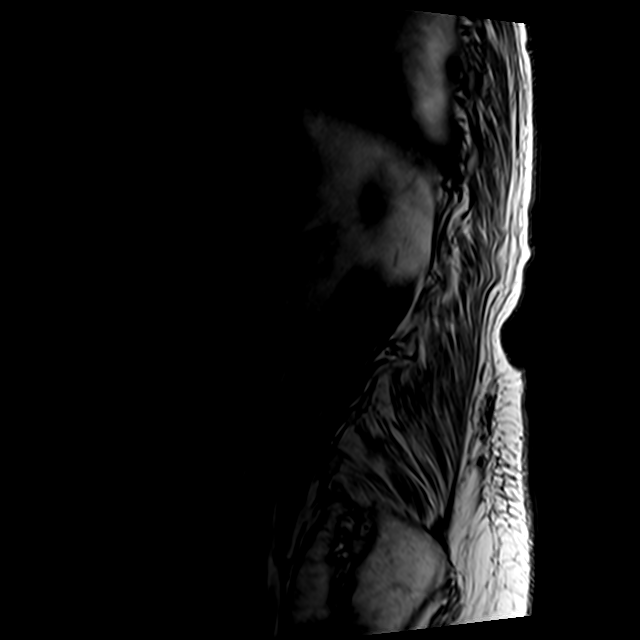
[im 4/19]
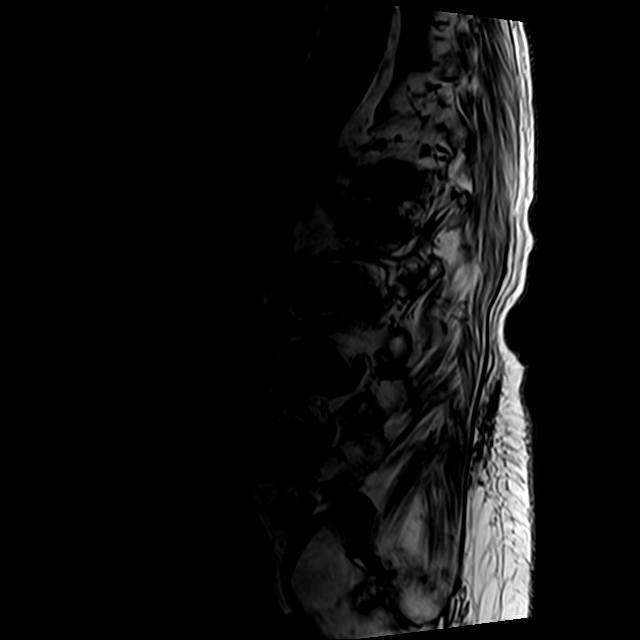
[im 7/19]
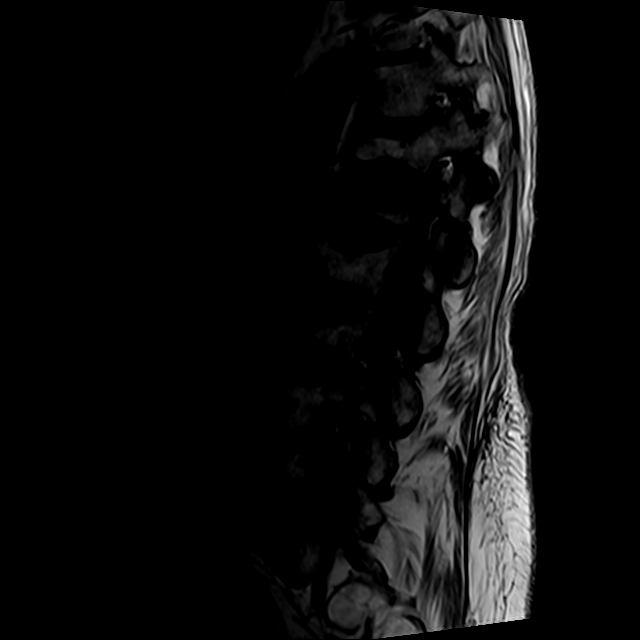
[im 10/19]
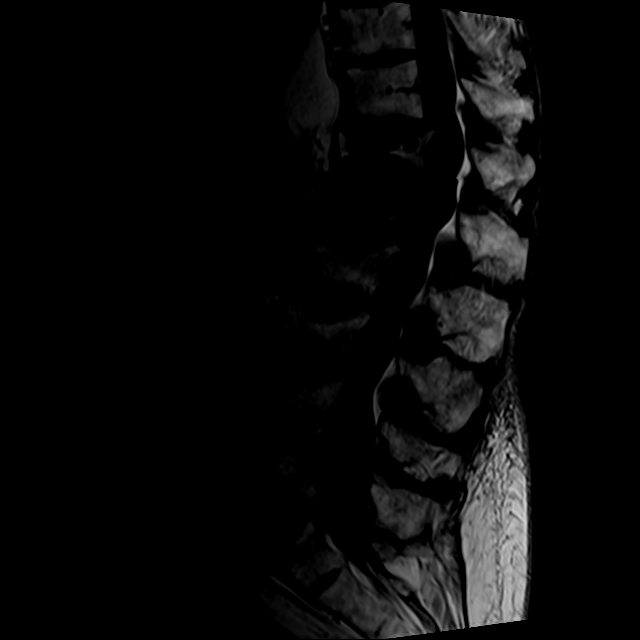
[im 13/19]
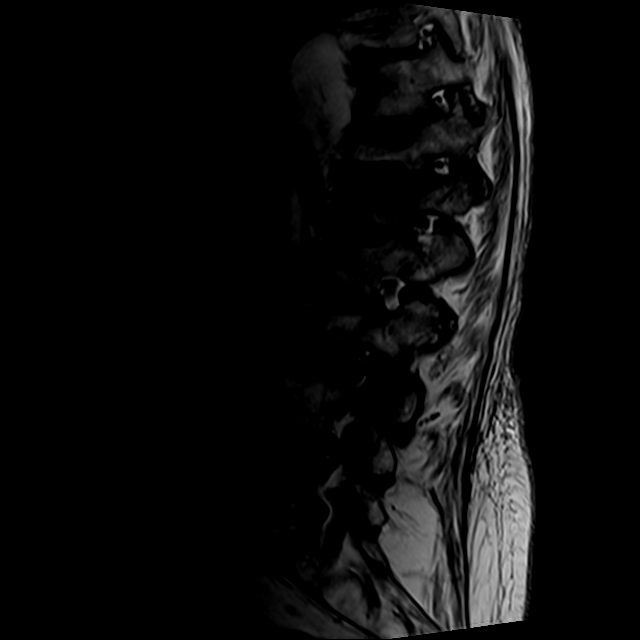
[im 16/19]
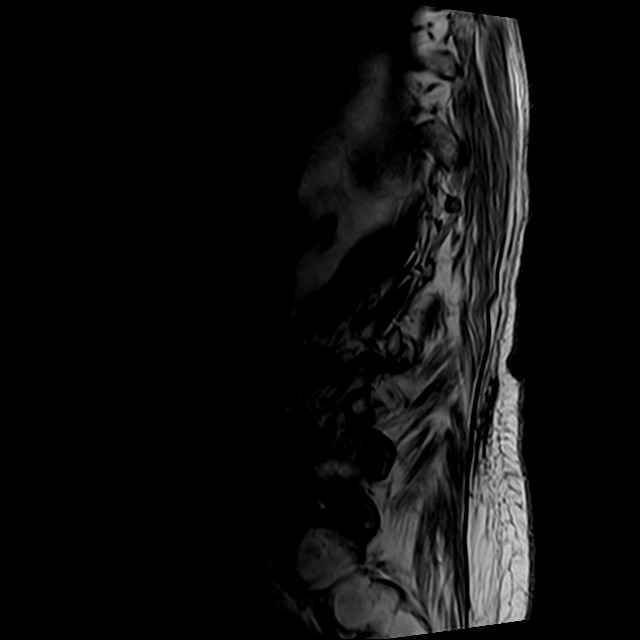

[Series 6: T2 · axial · 4.0mm · 0.78mm/px · z∈[-54,+142]mm · 8 of 40 slices shown (2 of 2)]
[im 1/40]
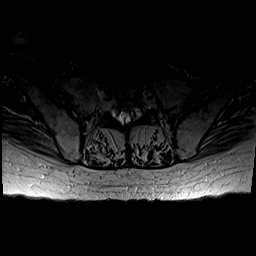
[im 7/40]
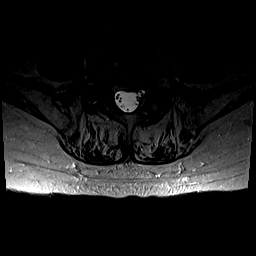
[im 13/40]
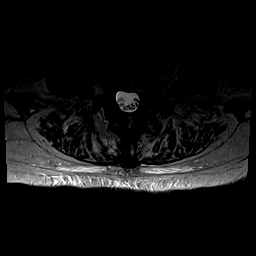
[im 19/40]
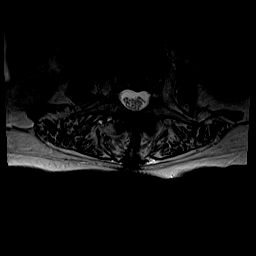
[im 22/40]
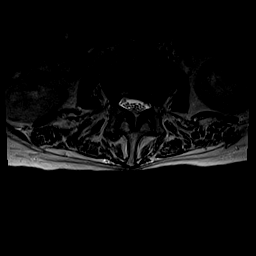
[im 28/40]
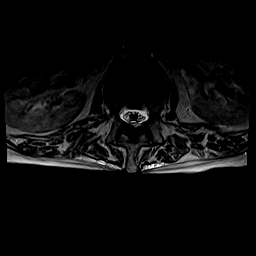
[im 34/40]
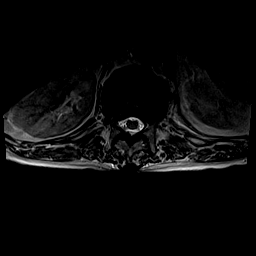
[im 40/40]
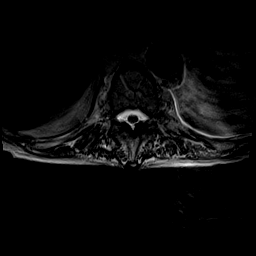

[Series 7: T1 · axial · 4.0mm · 0.31mm/px · z∈[-24,+112]mm · 3 of 40 slices shown (2 of 2)]
[im 7/40]
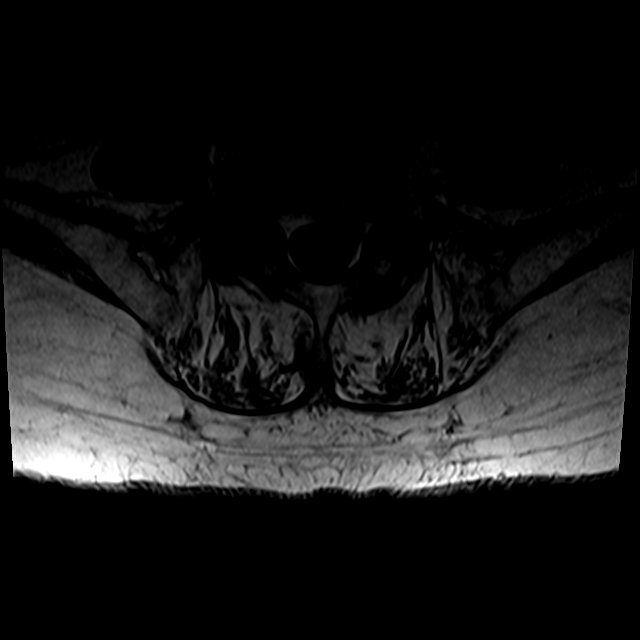
[im 22/40]
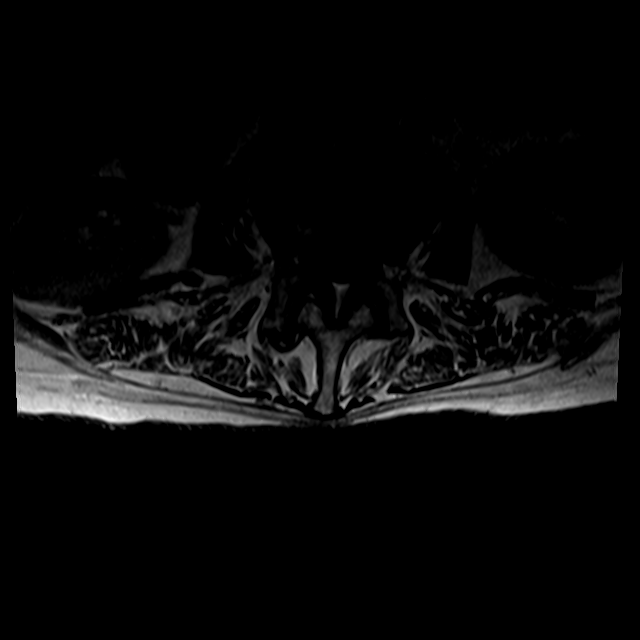
[im 34/40]
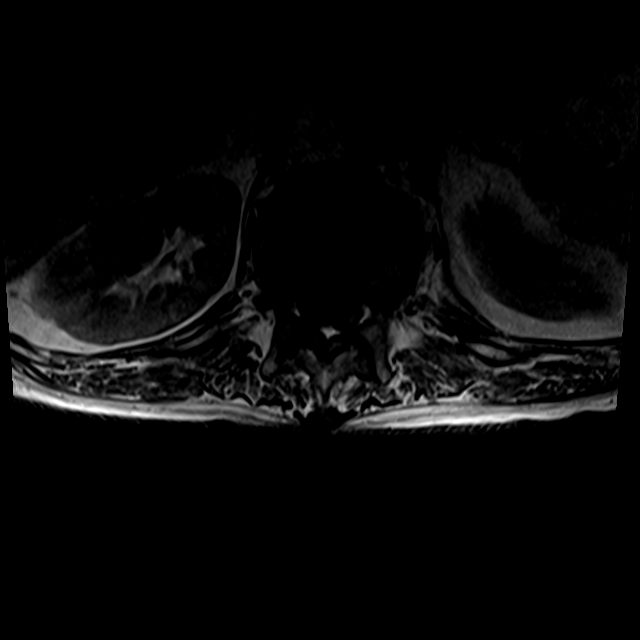

[23 of 48 positions shown; findings below may reference images not displayed]

FINDINGS: There are remote compression fractures of T12, L1, L2 and L3. Signal
changes at L1 and T[DATE] suggest some overlying acute progressive
compression. Normal overall alignment of the lumbar vertebral
bodies. The conus medullaris terminates at L2. The facets are
normally aligned. No pars defects.

No significant paraspinal or retroperitoneal findings.

T11-12: Mild retropulsion of T12 along with a bulging annulus with
mild mass effect on the ventral thecal sac and narrowing of the
ventral CSF space.

T12-L1: Mild retropulsion of T12 and L1 with mass effect on the
ventral thecal sac and narrowing of the ventral CSF space. Mild
foraminal narrowing bilaterally also.

L1-2: Bulging annulus and mild retropulsion of L1 with flattening of
the ventral thecal sac and mild bilateral lateral recess stenosis.
No significant foraminal stenosis.

L2-3: Diffuse annular bulge, short pedicles and facet disease with
mild spinal and bilateral lateral recess stenosis. No significant
foraminal stenosis.

L3-4: Bulging degenerated annulus, osteophytic ridging, short
pedicles and facet disease contributing to moderate spinal and
bilateral lateral recess stenosis. There is also mild bilateral
foraminal stenosis, right greater than left.

L4-5: Diffuse annular bulge, osteophytic ridging and facet disease
with mild bilateral lateral recess stenosis and mild bilateral
foraminal encroachment.

L5-S1: Bulging annulus and moderate facet disease but no spinal or
foraminal stenosis.
IMPRESSION: 1. Remote compression fractures of T12, L1, L2 and L3 with probable
subacute superimposed further compression of T12 and L1.
2. Multilevel multifactorial spinal, lateral recess and foraminal
stenosis as discussed above at the individual levels. The most
significant level is L3-4.
